# Patient Record
Sex: Female | Born: 1990 | Race: White | Hispanic: No | Marital: Single | Smoking: Never smoker
Health system: Southern US, Community
[De-identification: ages and names within clinical notes are randomized; demographics above are authoritative.]

## PROBLEM LIST (undated history)

## (undated) DIAGNOSIS — D649 Anemia, unspecified: Secondary | ICD-10-CM

## (undated) DIAGNOSIS — B009 Herpesviral infection, unspecified: Secondary | ICD-10-CM

## (undated) DIAGNOSIS — O24419 Gestational diabetes mellitus in pregnancy, unspecified control: Secondary | ICD-10-CM

## (undated) DIAGNOSIS — Z141 Cystic fibrosis carrier: Secondary | ICD-10-CM

## (undated) DIAGNOSIS — E669 Obesity, unspecified: Secondary | ICD-10-CM

## (undated) DIAGNOSIS — A5901 Trichomonal vulvovaginitis: Secondary | ICD-10-CM

## (undated) HISTORY — DX: Anemia, unspecified: D64.9

## (undated) HISTORY — DX: Gestational diabetes mellitus in pregnancy, unspecified control: O24.419

---

## 2017-07-11 ENCOUNTER — Other Ambulatory Visit: Payer: Self-pay

## 2017-07-11 ENCOUNTER — Emergency Department (HOSPITAL_COMMUNITY)
Admission: EM | Admit: 2017-07-11 | Discharge: 2017-07-11 | Disposition: A | Discharge status: Home / Self Care | Payer: Worker's Compensation | Attending: Emergency Medicine | Admitting: Emergency Medicine

## 2017-07-11 ENCOUNTER — Encounter (HOSPITAL_COMMUNITY): Payer: Self-pay | Admitting: *Deleted

## 2017-07-11 DIAGNOSIS — Z23 Encounter for immunization: Secondary | ICD-10-CM | POA: Insufficient documentation

## 2017-07-11 DIAGNOSIS — Y999 Unspecified external cause status: Secondary | ICD-10-CM | POA: Insufficient documentation

## 2017-07-11 DIAGNOSIS — Y929 Unspecified place or not applicable: Secondary | ICD-10-CM | POA: Diagnosis not present

## 2017-07-11 DIAGNOSIS — Y939 Activity, unspecified: Secondary | ICD-10-CM | POA: Diagnosis not present

## 2017-07-11 DIAGNOSIS — W540XXA Bitten by dog, initial encounter: Secondary | ICD-10-CM | POA: Diagnosis not present

## 2017-07-11 DIAGNOSIS — S81852A Open bite, left lower leg, initial encounter: Secondary | ICD-10-CM | POA: Diagnosis not present

## 2017-07-11 MED ORDER — MUPIROCIN CALCIUM 2 % EX CREA
TOPICAL_CREAM | Freq: Once | CUTANEOUS | Status: AC
  Administered 2017-07-11: 20:00:00 via TOPICAL
  Filled 2017-07-11: qty 15

## 2017-07-11 MED ORDER — AMOXICILLIN-POT CLAVULANATE 875-125 MG PO TABS: 1.0000 | ORAL_TABLET | Freq: Two times a day (BID) | ORAL | 0 refills | Status: DC

## 2017-07-11 MED ORDER — TETANUS-DIPHTH-ACELL PERTUSSIS 5-2.5-18.5 LF-MCG/0.5 IM SUSP
0.5000 mL | Freq: Once | INTRAMUSCULAR | Status: AC
  Administered 2017-07-11: 0.5 mL via INTRAMUSCULAR
  Filled 2017-07-11: qty 0.5

## 2017-07-11 MED ORDER — MUPIROCIN CALCIUM 2 % EX CREA: 1.0000 "application " | TOPICAL_CREAM | Freq: Two times a day (BID) | CUTANEOUS | 0 refills | Status: DC

## 2017-07-11 NOTE — ED Triage Notes (Signed)
The pt  Works for [employer] and today she was making a deliovery and she was bitten by a dog to her lt lower lateral leg this past Thursday.  The dog was unknown.  No fever but today her leg was burning and stinging.  Sl redness around the wound

## 2017-07-11 NOTE — ED Provider Notes (Signed)
[facility] Provider Note   CSN: [id number] Arrival date & time: 07/11/17  1805     History   Chief Complaint Chief Complaint  Patient presents with  . Animal Bite    HPI Sarah Villanueva is a 27 y.o. female presenting for evaluation of left lower leg pain.  Patient states that she was bit by a dog on Thursday of her left lower leg.  She treated it with alcohol, and has been keeping it covered.  Today she reports increasing swelling, pain, and burning at the site.  No drainage.  No pain with movement of the ankle or the foot.  Pain is worse with ambulation and palpation.  She has not taken anything for pain including Tylenol or ibuprofen.  No radiation of the pain.  No numbness or tingling.  No injury elsewhere.  She denies fevers, chills, nausea, vomiting.  She is not immunocompromised.  Not on blood thinners.  Unknown last tetanus shot.  Unknown if dogs are up-to-date on the rabies shots.  No allergies to antibiotics.  HPI  History reviewed. No pertinent past medical history.  There are no active problems to display for this patient.   History reviewed. No pertinent surgical history.   OB History   None      Home Medications    Prior to Admission medications   Medication Sig Start Date End Date Taking? Authorizing Provider  amoxicillin-clavulanate (AUGMENTIN) 875-125 MG tablet Take 1 tablet by mouth every 12 (twelve) hours. 07/11/17   Osie Merkin, PA-C  mupirocin cream (BACTROBAN) 2 % Apply 1 application topically 2 (two) times daily. 07/11/17   Trina Asch, PA-C    Family History No family history on file.  Social History Social History   Tobacco Use  . Smoking status: Never Smoker  . Smokeless tobacco: Never Used  Substance Use Topics  . Alcohol use: Yes  . Drug use: Not on file     Allergies   Patient has no known allergies.   Review of Systems Review of Systems  Skin: Positive for wound.    Allergic/Immunologic: Negative for immunocompromised state.  Neurological: Negative for numbness.  Hematological: Does not bruise/bleed easily.     Physical Exam Updated Vital Signs BP 126/72 (BP Location: Right Arm)   Pulse 87   Temp 98.7 F (37.1 C) (Oral)   Resp 17   Ht 5\' 11"  (1.803 m)   Wt 99.8 kg (220 lb)   LMP 06/20/2017   SpO2 100%   BMI 30.68 kg/m   Physical Exam  Constitutional: She is oriented to person, place, and time. She appears well-developed and well-nourished. No distress.  HENT:  Head: Normocephalic and atraumatic.  Eyes: EOM are normal.  Neck: Normal range of motion.  Pulmonary/Chest: Effort normal.  Abdominal: She exhibits no distension.  Musculoskeletal: Normal range of motion.  Neurological: She is alert and oriented to person, place, and time.  Skin: Skin is warm. No rash noted.  Lac of the left lateral lower leg without active bleeding or draining.  Mild surrounding erythema.  Tenderness to palpation of the surrounding skin.  Sensation intact bilaterally.  Pedal pulses equal bilaterally.  No streaking.  Psychiatric: She has a normal mood and affect.  Nursing note and vitals reviewed.      ED Treatments / Results  Labs (all labs ordered are listed, but only abnormal results are displayed) Labs Reviewed - No data to display  EKG None  Radiology No results found.  Procedures Procedures (including critical care time)  Medications Ordered in ED Medications  mupirocin cream (BACTROBAN) 2 % (has no administration in time range)  Tdap (BOOSTRIX) injection 0.5 mL (has no administration in time range)     Initial Impression / Assessment and Plan / ED Course  I have reviewed the triage vital signs and the nursing notes.  Pertinent labs & imaging results that were available during my care of the patient were reviewed by me and considered in my medical decision making (see chart for details).     Patient presenting for evaluation of  left lateral leg laceration.  Physical exam reassuring, no sign of streaking or systemic infection.  Will update tetanus.  Laceration cleaned and Bactroban applied.  Will start patient on Augmentin with daily Bactroban.  Discussed wound care.  Discussed follow-up if symptoms are not improving.  Discussed options for rabies treatment today versus contacting animal control to follow-up on rabies vaccination status.  Patient does not want rabies shots today.  At this time, patient appears safe for discharge.  Return precautions given.  Patient states she understands and agrees to plan.   Final Clinical Impressions(s) / ED Diagnoses   Final diagnoses:  Dog bite of left lower leg, initial encounter    ED Discharge Orders        Ordered    amoxicillin-clavulanate (AUGMENTIN) 875-125 MG tablet  Every 12 hours     07/11/17 1937    mupirocin cream (BACTROBAN) 2 %  2 times daily     07/11/17 1937       Alveria Apley, PA-C 07/11/17 1955    Nira Conn, MD 07/12/17 1306

## 2017-07-11 NOTE — ED Notes (Signed)
Pt has dog bite 1 inch long dog bite above ankle . Area is red. No discharge or foul odor.

## 2017-07-11 NOTE — Discharge Instructions (Signed)
Take antibiotics as prescribed.  Take the entire course, even if your symptoms improve. Use Bactroban ointment twice a day. Keep the area clean, wash daily with soap and water and apply a new dressing every day. Follow-up regarding the rabies status of the dog that bit you.  This can be done by contacting animal control. Return to the emergency room if your symptoms are not improving by Tuesday.  Return if you are developing high fevers, vomiting, red streaking up your leg, or any new or concerning symptoms.

## 2017-07-12 ENCOUNTER — Emergency Department (HOSPITAL_COMMUNITY)
Admission: EM | Admit: 2017-07-12 | Discharge: 2017-07-12 | Disposition: A | Discharge status: Home / Self Care | Payer: Worker's Compensation | Attending: Emergency Medicine | Admitting: Emergency Medicine

## 2017-07-12 ENCOUNTER — Emergency Department (HOSPITAL_COMMUNITY): Payer: Worker's Compensation

## 2017-07-12 ENCOUNTER — Encounter (HOSPITAL_COMMUNITY): Payer: Self-pay | Admitting: Emergency Medicine

## 2017-07-12 ENCOUNTER — Other Ambulatory Visit: Payer: Self-pay

## 2017-07-12 DIAGNOSIS — M791 Myalgia, unspecified site: Secondary | ICD-10-CM | POA: Diagnosis present

## 2017-07-12 DIAGNOSIS — T50A15A Adverse effect of pertussis vaccine, including combinations with a pertussis component, initial encounter: Secondary | ICD-10-CM | POA: Diagnosis not present

## 2017-07-12 DIAGNOSIS — T887XXA Unspecified adverse effect of drug or medicament, initial encounter: Secondary | ICD-10-CM | POA: Insufficient documentation

## 2017-07-12 DIAGNOSIS — Y658 Other specified misadventures during surgical and medical care: Secondary | ICD-10-CM | POA: Insufficient documentation

## 2017-07-12 DIAGNOSIS — R52 Pain, unspecified: Secondary | ICD-10-CM

## 2017-07-12 LAB — URINALYSIS, ROUTINE W REFLEX MICROSCOPIC
Bilirubin Urine: NEGATIVE
GLUCOSE, UA: NEGATIVE mg/dL
Hgb urine dipstick: NEGATIVE
Ketones, ur: NEGATIVE mg/dL
LEUKOCYTES UA: NEGATIVE
Nitrite: POSITIVE — AB
PROTEIN: NEGATIVE mg/dL
Specific Gravity, Urine: 1.026 (ref 1.005–1.030)
pH: 5 (ref 5.0–8.0)

## 2017-07-12 LAB — COMPREHENSIVE METABOLIC PANEL
ALBUMIN: 3.8 g/dL (ref 3.5–5.0)
ALT: 15 U/L (ref 14–54)
ANION GAP: 10 (ref 5–15)
AST: 21 U/L (ref 15–41)
Alkaline Phosphatase: 57 U/L (ref 38–126)
BUN: 9 mg/dL (ref 6–20)
CO2: 23 mmol/L (ref 22–32)
Calcium: 9.2 mg/dL (ref 8.9–10.3)
Chloride: 106 mmol/L (ref 101–111)
Creatinine, Ser: 0.77 mg/dL (ref 0.44–1.00)
GFR calc Af Amer: >60 mL/min (ref >60)
GFR calc non Af Amer: >60 mL/min (ref >60)
GLUCOSE: 97 mg/dL (ref 65–99)
POTASSIUM: 3.8 mmol/L (ref 3.5–5.1)
SODIUM: 139 mmol/L (ref 135–145)
Total Bilirubin: 0.8 mg/dL (ref 0.3–1.2)
Total Protein: 7.5 g/dL (ref 6.5–8.1)

## 2017-07-12 LAB — CBC WITH DIFFERENTIAL/PLATELET
BASOS ABS: 0 10*3/uL (ref 0.0–0.1)
Basophils Relative: 0 %
Eosinophils Absolute: 0.1 10*3/uL (ref 0.0–0.7)
Eosinophils Relative: 1 %
HEMATOCRIT: 38.7 % (ref 36.0–46.0)
Hemoglobin: 12.1 g/dL (ref 12.0–15.0)
LYMPHS PCT: 24 %
Lymphs Abs: 1.3 10*3/uL (ref 0.7–4.0)
MCH: 26.8 pg (ref 26.0–34.0)
MCHC: 31.3 g/dL (ref 30.0–36.0)
MCV: 85.6 fL (ref 78.0–100.0)
MONO ABS: 0.9 10*3/uL (ref 0.1–1.0)
MONOS PCT: 16 %
NEUTROS ABS: 3.3 10*3/uL (ref 1.7–7.7)
Neutrophils Relative %: 59 %
Platelets: 268 10*3/uL (ref 150–400)
RBC: 4.52 MIL/uL (ref 3.87–5.11)
RDW: 12.9 % (ref 11.5–15.5)
WBC: 5.5 10*3/uL (ref 4.0–10.5)

## 2017-07-12 LAB — I-STAT BETA HCG BLOOD, ED (MC, WL, AP ONLY): I-stat hCG, quantitative: <5 m[IU]/mL (ref <5)

## 2017-07-12 LAB — I-STAT CG4 LACTIC ACID, ED: Lactic Acid, Venous: 0.71 mmol/L (ref 0.5–1.9)

## 2017-07-12 MED ORDER — AMOXICILLIN-POT CLAVULANATE 875-125 MG PO TABS
1.0000 | ORAL_TABLET | Freq: Once | ORAL | Status: DC
  Filled 2017-07-12: qty 1

## 2017-07-12 MED ORDER — AMOXICILLIN-POT CLAVULANATE 400-57 MG/5ML PO SUSR
875.0000 mg | Freq: Once | ORAL | Status: AC
  Administered 2017-07-12: 872 mg via ORAL
  Filled 2017-07-12: qty 10.9

## 2017-07-12 NOTE — Discharge Instructions (Signed)
Your symptoms are likely from the fact that you had your tetanus shot updated yesterday. This is not abnormal to have these symptoms after getting an immunization. Alternate between tylenol and motrin as needed for pain, use ice or heat to the areas of soreness as needed to help with pain. Stay well hydrated, get plenty of rest. Take your antibiotics as directed until completed. Follow up with the [facility] in 1 week for recheck of symptoms and to establish medical care. Return to the ER for emergent changes or worsening symptoms.

## 2017-07-12 NOTE — ED Provider Notes (Signed)
[facility] EMERGENCY DEPARTMENT Provider Note   CSN: [id number] Arrival date & time: 07/12/17  4098     History   Chief Complaint Chief Complaint  Patient presents with  . Generalized Body Aches    arm pits are sore    HPI Sarah Villanueva is a 27 y.o. otherwise healthy female, who presents to the ED with complaints of fatigue/lightheadedness, body aches, soreness to her inguinal region and armpits bilaterally, and chills that began when she woke up this morning around 8:30am (4hrs prior to evaluation).  Chart review reveals that she was seen here last night for a dog bite, received Tdap shot, sent home with augmentin rx.  She states she hasn't yet started the augmentin, however this morning she woke up "not feeling well" with the aforementioned symptoms.  She has not tried anything for her symptoms and there are no known aggravating factors.  She denies fevers, URI symptoms, cough, CP, SOB, abd pain, N/V/D/C, hematuria, dysuria, arthralgias, HA, vision changes, neck stiffness, numbness, tingling, focal weakness, or any other complaints at this time.  She also denies any changes in her dog bite wound (which was originally sustained on Thursday, 3 days ago).   The history is provided by the patient and medical records. No language interpreter was used.  Illness  This is a new problem. The current episode started 3 to 5 hours ago. The problem occurs constantly. The problem has not changed since onset.Pertinent negatives include no chest pain, no abdominal pain, no headaches and no shortness of breath. Nothing aggravates the symptoms. Nothing relieves the symptoms. She has tried nothing for the symptoms. The treatment provided no relief.    History reviewed. No pertinent past medical history.  There are no active problems to display for this patient.   History reviewed. No pertinent surgical history.   OB History   None      Home Medications    Prior to Admission  medications   Medication Sig Start Date End Date Taking? Authorizing Provider  amoxicillin-clavulanate (AUGMENTIN) 875-125 MG tablet Take 1 tablet by mouth every 12 (twelve) hours. 07/11/17   Caccavale, Sophia, PA-C  mupirocin cream (BACTROBAN) 2 % Apply 1 application topically 2 (two) times daily. 07/11/17   Caccavale, Sophia, PA-C    Family History No family history on file.  Social History Social History   Tobacco Use  . Smoking status: Never Smoker  . Smokeless tobacco: Never Used  Substance Use Topics  . Alcohol use: Yes  . Drug use: Not on file     Allergies   Patient has no known allergies.   Review of Systems Review of Systems  Constitutional: Positive for chills and fatigue. Negative for fever.  HENT: Negative for rhinorrhea and sore throat.   Eyes: Negative for visual disturbance.  Respiratory: Negative for cough and shortness of breath.   Cardiovascular: Negative for chest pain.  Gastrointestinal: Negative for abdominal pain, constipation, diarrhea, nausea and vomiting.  Genitourinary: Negative for dysuria and hematuria.  Musculoskeletal: Positive for myalgias. Negative for arthralgias, neck pain and neck stiffness.  Skin: Negative for color change.  Allergic/Immunologic: Negative for immunocompromised state.  Neurological: Positive for light-headedness. Negative for weakness, numbness and headaches.  Psychiatric/Behavioral: Negative for confusion.   All other systems reviewed and are negative for acute change except as noted in the HPI.    Physical Exam Updated Vital Signs BP 119/78 (BP Location: Right Arm)   Pulse 86   Temp 98.8 F (37.1 C) (Oral)  Resp 16   Ht 5\' 11"  (1.803 m)   Wt 99.8 kg (220 lb)   LMP 06/20/2017   SpO2 100%   BMI 30.68 kg/m   Physical Exam  Constitutional: She is oriented to person, place, and time. Vital signs are normal. She appears well-developed and well-nourished.  Non-toxic appearance. No distress.  Afebrile, nontoxic, NAD   HENT:  Head: Normocephalic and atraumatic.  Mouth/Throat: Oropharynx is clear and moist and mucous membranes are normal.  Eyes: Conjunctivae and EOM are normal. Right eye exhibits no discharge. Left eye exhibits no discharge.  Neck: Normal range of motion. Neck supple.  Cardiovascular: Normal rate, regular rhythm, normal heart sounds and intact distal pulses. Exam reveals no gallop and no friction rub.  No murmur heard. Pulmonary/Chest: Effort normal and breath sounds normal. No respiratory distress. She has no decreased breath sounds. She has no wheezes. She has no rhonchi. She has no rales.  Abdominal: Soft. Normal appearance and bowel sounds are normal. She exhibits no distension. There is no tenderness. There is no rigidity, no rebound, no guarding, no CVA tenderness, no tenderness at McBurney's point and negative Murphy's sign.  Musculoskeletal: Normal range of motion.  Neurological: She is alert and oriented to person, place, and time. She has normal strength. No sensory deficit.  Skin: Skin is warm, dry and intact. No rash noted.  Psychiatric: She has a normal mood and affect.  Nursing note and vitals reviewed.    ED Treatments / Results  Labs (all labs ordered are listed, but only abnormal results are displayed) Labs Reviewed  URINALYSIS, ROUTINE W REFLEX MICROSCOPIC - Abnormal; Notable for the following components:      Result Value   APPearance HAZY (*)    Nitrite POSITIVE (*)    Bacteria, UA FEW (*)    Squamous Epithelial / LPF 0-5 (*)    All other components within normal limits  COMPREHENSIVE METABOLIC PANEL  CBC WITH DIFFERENTIAL/PLATELET  I-STAT CG4 LACTIC ACID, ED  I-STAT BETA HCG BLOOD, ED ([facility], [facility], [facility] ONLY)    EKG None  Radiology Dg Chest 2 View  Result Date: 07/12/2017 CLINICAL DATA:  Body aches EXAM: CHEST - 2 VIEW COMPARISON:  08/24/2007 FINDINGS: The heart size and mediastinal contours are within normal limits. Both lungs are clear. The visualized  skeletal structures are unremarkable. IMPRESSION: No active cardiopulmonary disease. Electronically Signed   By: Marlan Palau M.D.   On: 07/12/2017 10:04    Procedures Procedures (including critical care time)  Medications Ordered in ED Medications  amoxicillin-clavulanate (AUGMENTIN) 400-57 MG/5ML suspension 872 mg (872 mg Oral Given 07/12/17 1339)     Initial Impression / Assessment and Plan / ED Course  I have reviewed the triage vital signs and the nursing notes.  Pertinent labs & imaging results that were available during my care of the patient were reviewed by me and considered in my medical decision making (see chart for details).     27 y.o. female here with body aches, fatigue/lightheadedness, soreness in the groin/armpits, and chills this morning after getting her TDap updated yesterday for a dog bite. On exam, no meningismus, afebrile and nontoxic, in NAD, no abdominal tenderness, clear lungs, dog bite to LLE with minimal erythema around the edges of the wound but no warmth or spreading redness, no drainage, no evidence of cellulitis. Work up done in triage shows: lactic WNL, betaHCG neg, CMP WNL, CBC WNL, CXR neg, U/A with positive nitrites but otherwise no evidence of infection. Pt without  symptoms of UTI, doubt this is clinically significant. Overall, her symptoms are likely just due to normal immune response from getting immunization, doubt other acute emergent pathology. Advised tylenol/motrin/ice/heat use, continuation of her antibiotics (she hasn't started yet, so first dose given here), and f/up with [facility] in 1wk for recheck of symptoms and to establish medical care. I explained the diagnosis and have given explicit precautions to return to the ER including for any other new or worsening symptoms. The patient understands and accepts the medical plan as it's been dictated and I have answered their questions. Discharge instructions concerning home care and prescriptions have been  given. The patient is STABLE and is discharged to home in good condition.    Final Clinical Impressions(s) / ED Diagnoses   Final diagnoses:  Body aches  Reaction to DTaP immunization, initial encounter    ED Discharge Orders    [address]       Shakeitha Umbaugh, [place]restMercedes, [place] JerseyPA-C 07/12/17 1351    Tilden Fossaees, Elizabeth, MD 07/12/17 1701

## 2017-07-12 NOTE — ED Triage Notes (Signed)
Pt. Stated, I was here yesterday for a dog bite and was given a Tetanus and last night I started having flu-like symptoms. And my arm pits are sore.

## 2018-04-19 ENCOUNTER — Encounter: Payer: Self-pay | Admitting: Obstetrics & Gynecology

## 2018-04-19 ENCOUNTER — Ambulatory Visit: Payer: Self-pay | Admitting: Obstetrics & Gynecology

## 2018-04-19 VITALS — BP 113/66 | HR 87 | Ht 71.0 in | Wt 210.0 lb

## 2018-04-19 DIAGNOSIS — Z01419 Encounter for gynecological examination (general) (routine) without abnormal findings: Secondary | ICD-10-CM

## 2018-04-19 DIAGNOSIS — N926 Irregular menstruation, unspecified: Secondary | ICD-10-CM

## 2018-04-19 DIAGNOSIS — Z113 Encounter for screening for infections with a predominantly sexual mode of transmission: Secondary | ICD-10-CM

## 2018-04-19 DIAGNOSIS — Z124 Encounter for screening for malignant neoplasm of cervix: Secondary | ICD-10-CM

## 2018-04-19 NOTE — Progress Notes (Signed)
Subjective:    Sarah Villanueva is a 28 y.o. single P0 female who presents for an annual exam. She has the issues of irregular periods and some pelvic. The pain is generally daily, "sharp cramping". She has not tried IBU/tylenol.  The patient is sexually active. GYN screening history: no prior history of gyn screening tests. The patient wears seatbelts: yes. The patient participates in regular exercise: yes. Has the patient ever been transfused or tattooed?: no. The patient reports that there is not domestic violence in her life.   Menstrual History: OB History    Gravida  1   Para      Term      Preterm      AB  1   Living        SAB      TAB  1   Ectopic      Multiple      Live Births              Menarche age: [age] [address] at [age] She does not use contraception. She would be ok if she got pregnant, on PNV. In 2019 she had monthly periods. Since the middle of 12/19 she has had off and on bleeding every other week. It is heavy. Monogamous for about 18 months  Patient's last menstrual period was 04/07/2018.    The following portions of the patient's history were reviewed and updated as appropriate: allergies, current medications, past family history, past medical history, past social history, past surgical history and problem list.  Review of Systems Pertinent items are noted in HPI.   FH- + breast cancer- paternal great aunt and great GM, no colon cancer Works at [employer]    Objective:    BP 113/66   Pulse 87   Ht 5\' 11"  (1.803 m)   Wt 210 lb (95.3 kg)   LMP 04/07/2018   BMI 29.29 kg/m   General Appearance:    Alert, cooperative, no distress, appears stated age  Head:    Normocephalic, without obvious abnormality, atraumatic  Eyes:    PERRL, conjunctiva/corneas clear, EOM's intact, fundi    benign, both eyes  Ears:    Normal TM's and external ear canals, both ears  Nose:   Nares normal, septum midline, mucosa normal, no drainage    or sinus tenderness   Throat:   Lips, mucosa, and tongue normal; teeth and gums normal  Neck:   Supple, symmetrical, trachea midline, no adenopathy;    thyroid:  no enlargement/tenderness/nodules; no carotid   bruit or JVD  Back:     Symmetric, no curvature, ROM normal, no CVA tenderness  Lungs:     Clear to auscultation bilaterally, respirations unlabored  Chest Wall:    No tenderness or deformity   Heart:    Regular rate and rhythm, S1 and S2 normal, no murmur, rub   or gallop  Breast Exam:    No tenderness, masses, or nipple abnormality  Abdomen:     Soft, non-tender, bowel sounds active all four quadrants,    no masses, no organomegaly  Genitalia:    Normal female without lesion, discharge or tenderness, nulliparous cervix,  normal size and shape, anteverted, mobile, non-tender, normal adnexal exam      Extremities:   Extremities normal, atraumatic, no cyanosis or edema  Pulses:   2+ and symmetric all extremities  Skin:   Skin color, texture, turgor normal, no rashes or lesions  Lymph nodes:   Cervical, supraclavicular, and axillary  nodes normal  Neurologic:   CNII-XII intact, normal strength, sensation and reflexes    throughout  .    Assessment:    Healthy female exam.   Irregular bleeding   Plan:     Thin prep Pap smear. with STI testing Information on Gardasil given She declines a flu vaccine Gyn u/s ordered Check TSH

## 2018-04-19 NOTE — Patient Instructions (Signed)
Human Papillomavirus Quadrivalent Vaccine suspension for injection What is this medicine? HUMAN PAPILLOMAVIRUS VACCINE (HYOO muhn pap uh LOH muh vahy ruhs vak SEEN) is a vaccine. It is used to prevent infections of four types of the human papillomavirus. In women, the vaccine may lower your risk of getting cervical, vaginal, vulvar, or anal cancer and genital warts. In men, the vaccine may lower your risk of getting genital warts and anal cancer. You cannot get these diseases from the vaccine. This vaccine does not treat these diseases. This medicine may be used for other purposes; ask your health care provider or pharmacist if you have questions. COMMON BRAND NAME(S): Gardasil What should I tell my health care provider before I take this medicine? They need to know if you have any of these conditions: -fever or infection -hemophilia -HIV infection or AIDS -immune system problems -low platelet count -an unusual reaction to Human Papillomavirus Vaccine, yeast, other medicines, foods, dyes, or preservatives -pregnant or trying to get pregnant -breast-feeding How should I use this medicine? This vaccine is for injection in a muscle on your upper arm or thigh. It is given by a health care professional. Dennis Bast will be observed for 15 minutes after each dose. Sometimes, fainting happens after the vaccine is given. You may be asked to sit or lie down during the 15 minutes. Three doses are given. The second dose is given 2 months after the first dose. The last dose is given 4 months after the second dose. A copy of a Vaccine Information Statement will be given before each vaccination. Read this sheet carefully each time. The sheet may change frequently. Talk to your pediatrician regarding the use of this medicine in children. While this drug may be prescribed for children as young as 11 years of age for selected conditions, precautions do apply. Overdosage: If you think you have taken too much of this  medicine contact a poison control center or emergency room at once. NOTE: This medicine is only for you. Do not share this medicine with others. What if I miss a dose? All 3 doses of the vaccine should be given within 6 months. Remember to keep appointments for follow-up doses. Your health care provider will tell you when to return for the next vaccine. Ask your health care professional for advice if you are unable to keep an appointment or miss a scheduled dose. What may interact with this medicine? -other vaccines This list may not describe all possible interactions. Give your health care provider a list of all the medicines, herbs, non-prescription drugs, or dietary supplements you use. Also tell them if you smoke, drink alcohol, or use illegal drugs. Some items may interact with your medicine. What should I watch for while using this medicine? This vaccine may not fully protect everyone. Continue to have regular pelvic exams and cervical or anal cancer screenings as directed by your doctor. The Human Papillomavirus is a sexually transmitted disease. It can be passed by any kind of sexual activity that involves genital contact. The vaccine works best when given before you have any contact with the virus. Many people who have the virus do not have any signs or symptoms. Tell your doctor or health care professional if you have any reaction or unusual symptom after getting the vaccine. What side effects may I notice from receiving this medicine? Side effects that you should report to your doctor or health care professional as soon as possible: -allergic reactions like skin rash, itching or hives, swelling  from receiving this medicine?  Side effects that you should report to your doctor or health care professional as soon as possible:  -allergic reactions like skin rash, itching or hives, swelling of the face, lips, or tongue  -breathing problems  -feeling faint or lightheaded, falls  Side effects that usually do not require medical attention (report to your doctor or health care professional if they continue or are bothersome):  -cough  -dizziness  -fever  -headache  -nausea  -redness, warmth,  swelling, pain, or itching at site where injected  This list may not describe all possible side effects. Call your doctor for medical advice about side effects. You may report side effects to FDA at 1-800-FDA-1088.  Where should I keep my medicine?  This drug is given in a hospital or clinic and will not be stored at home.  NOTE: This sheet is a summary. It may not cover all possible information. If you have questions about this medicine, talk to your doctor, pharmacist, or health care provider.   2019 Elsevier/Gold Standard (2013-05-16 13:14:33)

## 2018-04-19 NOTE — Progress Notes (Signed)
Pt c/o irregular bleeding and pelvic pain

## 2018-04-21 LAB — CYTOLOGY - PAP
CHLAMYDIA, DNA PROBE: NEGATIVE
DIAGNOSIS: NEGATIVE
Neisseria Gonorrhea: NEGATIVE

## 2018-04-22 ENCOUNTER — Emergency Department (HOSPITAL_COMMUNITY): Payer: Self-pay

## 2018-04-22 ENCOUNTER — Encounter (HOSPITAL_COMMUNITY): Payer: Self-pay

## 2018-04-22 ENCOUNTER — Other Ambulatory Visit: Payer: Self-pay

## 2018-04-22 ENCOUNTER — Emergency Department (HOSPITAL_COMMUNITY)
Admission: EM | Admit: 2018-04-22 | Discharge: 2018-04-23 | Disposition: A | Discharge status: Home / Self Care | Payer: Self-pay | Attending: Emergency Medicine | Admitting: Emergency Medicine

## 2018-04-22 DIAGNOSIS — R079 Chest pain, unspecified: Secondary | ICD-10-CM | POA: Insufficient documentation

## 2018-04-22 LAB — CBC
HEMATOCRIT: 37.9 % (ref 36.0–46.0)
Hemoglobin: 11.9 g/dL — ABNORMAL LOW (ref 12.0–15.0)
MCH: 27.1 pg (ref 26.0–34.0)
MCHC: 31.4 g/dL (ref 30.0–36.0)
MCV: 86.3 fL (ref 80.0–100.0)
Platelets: 392 10*3/uL (ref 150–400)
RBC: 4.39 MIL/uL (ref 3.87–5.11)
RDW: 12.7 % (ref 11.5–15.5)
WBC: 13 10*3/uL — AB (ref 4.0–10.5)
nRBC: 0 % (ref 0.0–0.2)

## 2018-04-22 LAB — BASIC METABOLIC PANEL
Anion gap: 8 (ref 5–15)
BUN: 12 mg/dL (ref 6–20)
CHLORIDE: 106 mmol/L (ref 98–111)
CO2: 26 mmol/L (ref 22–32)
CREATININE: 0.68 mg/dL (ref 0.44–1.00)
Calcium: 9.8 mg/dL (ref 8.9–10.3)
GFR calc Af Amer: >60 mL/min (ref >60)
GFR calc non Af Amer: >60 mL/min (ref >60)
GLUCOSE: 91 mg/dL (ref 70–99)
Potassium: 3.6 mmol/L (ref 3.5–5.1)
SODIUM: 140 mmol/L (ref 135–145)

## 2018-04-22 LAB — I-STAT TROPONIN, ED: Troponin i, poc: 0 ng/mL (ref 0.00–0.08)

## 2018-04-22 LAB — I-STAT BETA HCG BLOOD, ED (MC, WL, AP ONLY): I-stat hCG, quantitative: <5 m[IU]/mL (ref <5)

## 2018-04-22 MED ORDER — SODIUM CHLORIDE 0.9% FLUSH: 3.0000 mL | Freq: Once | INTRAVENOUS | Status: DC

## 2018-04-22 NOTE — ED Triage Notes (Signed)
Pt here with chest pain this this am.  Pain in the center of the chest that goes to her back.  A&Ox4.  No shortness of breath.

## 2018-04-23 NOTE — Discharge Instructions (Signed)
You lab tests, ECG, and chest x-ray are all essentially negative. You can be discharged home and should follow up with primary care if the symptoms return. Come back to the emergency department if your pain becomes severe, you develop a high fever or for new concern.

## 2018-04-23 NOTE — ED Provider Notes (Signed)
[facility] EMERGENCY DEPARTMENT Provider Note   CSN: [id number] Arrival date & time: 04/22/18  1907     History   Chief Complaint Chief Complaint  Patient presents with  . Chest Pain    HPI Sarah Villanueva is a 28 y.o. female.  Patient to ED with left chest pain that started yesterday morning while getting ready for work and remaining constant throughout the day. She states "I feel out of breath but I'm not short of breath". No cough, congestion, fever. The pain radiated into her back earlier in the day. No aggravating or alleviating factors, including eating/drinking, position, exertion. No risk factors for PE. She states the pain has resolved now without any intervention.  The history is provided by the patient. No language interpreter was used.  Chest Pain  Associated symptoms: no cough, no fever, no nausea, no palpitations, no vomiting and no weakness     History reviewed. No pertinent past medical history.  There are no active problems to display for this patient.   History reviewed. No pertinent surgical history.   OB History    Gravida  1   Para      Term      Preterm      AB  1   Living        SAB      TAB  1   Ectopic      Multiple      Live Births               Home Medications    Prior to Admission medications   Not on File    Family History Family History  Problem Relation Age of Onset  . Hypertension Paternal Grandfather   . Diabetes Paternal Grandfather   . Hypertension Paternal Grandmother   . Diabetes Paternal Grandmother   . Diabetes Father   . Hypertension Father   . Cervical cancer Mother     Social History Social History   Tobacco Use  . Smoking status: Never Smoker  . Smokeless tobacco: Never Used  Substance Use Topics  . Alcohol use: Yes    Comment: occ  . Drug use: Never     Allergies   Patient has no known allergies.   Review of Systems Review of Systems  Constitutional:  Negative for chills and fever.  HENT: Negative.   Respiratory: Negative.  Negative for cough.        See HPI.  Cardiovascular: Positive for chest pain. Negative for palpitations.  Gastrointestinal: Negative.  Negative for nausea and vomiting.  Musculoskeletal: Negative.  Negative for myalgias.  Skin: Negative.   Neurological: Negative.  Negative for syncope, weakness and light-headedness.     Physical Exam Updated Vital Signs BP 98/60   Pulse 69   Temp 98 F (36.7 C) (Oral)   Resp 16   Ht 5\' 11"  (1.803 m)   Wt 95.3 kg   LMP 04/07/2018   SpO2 97%   BMI 29.29 kg/m   Physical Exam Vitals signs and nursing note reviewed.  Constitutional:      Appearance: She is well-developed.  HENT:     Head: Normocephalic.  Neck:     Musculoskeletal: Normal range of motion and neck supple.  Cardiovascular:     Rate and Rhythm: Normal rate and regular rhythm.     Heart sounds: No murmur.  Pulmonary:     Effort: Pulmonary effort is normal.     Breath sounds: Normal breath sounds.  No wheezing, rhonchi or rales.  Chest:     Chest wall: No tenderness.  Abdominal:     General: Bowel sounds are normal.     Palpations: Abdomen is soft.     Tenderness: There is no abdominal tenderness. There is no guarding or rebound.  Musculoskeletal: Normal range of motion.     Right lower leg: No edema.     Left lower leg: No edema.  Skin:    General: Skin is warm and dry.     Findings: No rash.  Neurological:     Mental Status: She is alert and oriented to person, place, and time.      ED Treatments / Results  Labs (all labs ordered are listed, but only abnormal results are displayed) Labs Reviewed  CBC - Abnormal; Notable for the following components:      Result Value   WBC 13.0 (*)    Hemoglobin 11.9 (*)    All other components within normal limits  BASIC METABOLIC PANEL  I-STAT TROPONIN, ED  I-STAT BETA HCG BLOOD, ED ([facility], [facility], [facility] ONLY)    EKG EKG  Interpretation  Date/Time:  Thursday April 22 2018 19:21:47 EST Ventricular Rate:  70 PR Interval:  148 QRS Duration: 96 QT Interval:  366 QTC Calculation: 395 R Axis:   80 Text Interpretation:  Normal sinus rhythm Normal ECG No old tracing to compare Confirmed by Dione BoozeGlick, David ([id number]) on 04/23/2018 2:51:46 AM   Radiology Dg Chest 2 View  Result Date: 04/22/2018 CLINICAL DATA:  LEFT side chest pain, nausea, and shortness of breath for 1 day EXAM: CHEST - 2 VIEW COMPARISON:  07/12/2017 FINDINGS: Normal heart size, mediastinal contours, and pulmonary vascularity. Lungs clear. No pleural effusion or pneumothorax. Bones unremarkable. IMPRESSION: Normal exam. Electronically Signed   By: Ulyses SouthwardMark  Boles M.D.   On: 04/22/2018 20:19    Procedures Procedures (including critical care time)  Medications Ordered in ED Medications  sodium chloride flush (NS) 0.9 % injection 3 mL (has no administration in time range)     Initial Impression / Assessment and Plan / ED Course  I have reviewed the triage vital signs and the nursing notes.  Pertinent labs & imaging results that were available during my care of the patient were reviewed by me and considered in my medical decision making (see chart for details).     Patient to ED with left chest pain, sharp, constant for 24 hours, now resolved. No cough, fever. No modifying factors.   Pain atypical for ACS, negative troponin, EKG. Doubt acute event. No risk factors for PE and she is PERC negative. No cough, congestion, fever or x-ray findings to suggest infection. No chest wall tenderness to suggest musculoskeletal pain. Pain is resolved at present.   She can be discharged home and is encouraged to follow up with PCP.    Final Clinical Impressions(s) / ED Diagnoses   Final diagnoses:  None   1. Nonspecific chest pain  ED Discharge Orders    None       Elpidio AnisUpstill, Reshawn Ostlund, PA-C 04/23/18 0441    Dione BoozeGlick, David, MD 04/23/18 [phone]

## 2018-04-27 ENCOUNTER — Ambulatory Visit (INDEPENDENT_AMBULATORY_CARE_PROVIDER_SITE_OTHER): Payer: Self-pay

## 2018-04-27 DIAGNOSIS — N926 Irregular menstruation, unspecified: Secondary | ICD-10-CM

## 2018-09-09 ENCOUNTER — Other Ambulatory Visit: Payer: Self-pay

## 2018-09-09 ENCOUNTER — Ambulatory Visit (INDEPENDENT_AMBULATORY_CARE_PROVIDER_SITE_OTHER): Payer: 59 | Admitting: Obstetrics & Gynecology

## 2018-09-09 ENCOUNTER — Encounter: Payer: Self-pay | Admitting: Obstetrics & Gynecology

## 2018-09-09 VITALS — BP 112/75 | HR 75 | Ht 71.0 in | Wt 215.0 lb

## 2018-09-09 DIAGNOSIS — N760 Acute vaginitis: Secondary | ICD-10-CM

## 2018-09-09 DIAGNOSIS — B9689 Other specified bacterial agents as the cause of diseases classified elsewhere: Secondary | ICD-10-CM | POA: Diagnosis not present

## 2018-09-09 DIAGNOSIS — N898 Other specified noninflammatory disorders of vagina: Secondary | ICD-10-CM | POA: Diagnosis not present

## 2018-09-09 MED ORDER — FLUCONAZOLE 150 MG PO TABS: 150.0000 mg | ORAL_TABLET | Freq: Once | ORAL | 3 refills | Status: AC

## 2018-09-09 NOTE — Progress Notes (Signed)
Patient ID: Sarah Villanueva, female   DOB: 03-17-1991, 28 y.o.   MRN: [id number]  Chief Complaint  Patient presents with  . Painful Intercourse    HPI Sarah Villanueva is a 28 y.o. female. She is here today with the issue of some intermittent burning/discomfort after sex. This first started 2 1/2 weeks ago. She has not tried any OTC creams. She tried a pH balance vaginal wash which may have helped a little. She has had sex about 5 times in the last 2 1/2 weeks. The burning sensation has been there after about 3 of those occasions. She is not using condoms, lubricants, toys. Besides the new pH wash, she has not changed any detergents, soaps, shampoos, etc.   History reviewed. No pertinent past medical history.  History reviewed. No pertinent surgical history.  Family History  Problem Relation Age of Onset  . Hypertension Paternal Grandfather   . Diabetes Paternal Grandfather   . Hypertension Paternal Grandmother   . Diabetes Paternal Grandmother   . Diabetes Father   . Hypertension Father   . Cervical cancer Mother     Social History Social History   Tobacco Use  . Smoking status: Never Smoker  . Smokeless tobacco: Never Used  Substance Use Topics  . Alcohol use: Yes    Comment: occ  . Drug use: Never    No Known Allergies  No current outpatient medications on file.   No current facility-administered medications for this visit.     Review of Systems Review of Systems Pap normal 1/20 Works at [employer] Ex She has not had a new sexual partner  Blood pressure 112/75, pulse 75, height 5\' 11"  (1.803 m), weight 215 lb (97.5 kg), last menstrual period 09/07/2018.  Physical Exam Physical Exam Breathing, conversing, and ambulating normally Well nourished, well hydrated White female, no apparent distress EG- shaved, all normal Spec exam reveals discharge c/w yeast Data Reviewed Pap normal Assessment Vulvar burning after sex, I suspect that yeast infection is the  etiology  Plan Wet prep sent Diflucan prescribed with refills Call back if not better by next week    Kerman Pfost C [place] 09/09/2018, 2:08 PM

## 2018-09-10 LAB — CERVICOVAGINAL ANCILLARY ONLY
Bacterial vaginitis: POSITIVE — AB
Candida vaginitis: NEGATIVE

## 2018-09-13 ENCOUNTER — Other Ambulatory Visit: Payer: Self-pay | Admitting: Obstetrics & Gynecology

## 2018-09-13 MED ORDER — METRONIDAZOLE 500 MG PO TABS: 500.0000 mg | ORAL_TABLET | Freq: Two times a day (BID) | ORAL | 0 refills | Status: DC

## 2018-09-13 NOTE — Progress Notes (Unsigned)
Flagyl prescribed for BV seen on wet prep.

## 2018-09-20 ENCOUNTER — Other Ambulatory Visit: Payer: Self-pay | Admitting: *Deleted

## 2018-09-20 MED ORDER — METRONIDAZOLE 500 MG PO TABS: 500.0000 mg | ORAL_TABLET | Freq: Two times a day (BID) | ORAL | 0 refills | Status: DC

## 2018-12-03 ENCOUNTER — Telehealth: Payer: Self-pay | Admitting: Obstetrics & Gynecology

## 2018-12-03 NOTE — Telephone Encounter (Signed)
I returned her call and left a voicemail. I told her that cramping is not unusual. I told her that if IBU and tylenol relieve the pain, then she should be reassured. If not, she should make an appt to be seen in the office.

## 2018-12-14 ENCOUNTER — Encounter: Payer: Self-pay | Admitting: *Deleted

## 2018-12-15 ENCOUNTER — Encounter: Payer: Self-pay | Admitting: Obstetrics & Gynecology

## 2018-12-16 ENCOUNTER — Other Ambulatory Visit: Payer: Self-pay

## 2018-12-16 ENCOUNTER — Encounter: Payer: Self-pay | Admitting: Obstetrics & Gynecology

## 2018-12-16 ENCOUNTER — Ambulatory Visit (INDEPENDENT_AMBULATORY_CARE_PROVIDER_SITE_OTHER): Payer: 59 | Admitting: Obstetrics & Gynecology

## 2018-12-16 ENCOUNTER — Other Ambulatory Visit: Payer: 59

## 2018-12-16 DIAGNOSIS — Z113 Encounter for screening for infections with a predominantly sexual mode of transmission: Secondary | ICD-10-CM

## 2018-12-16 DIAGNOSIS — O99210 Obesity complicating pregnancy, unspecified trimester: Secondary | ICD-10-CM

## 2018-12-16 DIAGNOSIS — Z6832 Body mass index (BMI) 32.0-32.9, adult: Secondary | ICD-10-CM | POA: Insufficient documentation

## 2018-12-16 DIAGNOSIS — O99211 Obesity complicating pregnancy, first trimester: Secondary | ICD-10-CM

## 2018-12-16 DIAGNOSIS — Z3A10 10 weeks gestation of pregnancy: Secondary | ICD-10-CM | POA: Diagnosis not present

## 2018-12-16 DIAGNOSIS — Z3490 Encounter for supervision of normal pregnancy, unspecified, unspecified trimester: Secondary | ICD-10-CM | POA: Insufficient documentation

## 2018-12-16 MED ORDER — BLOOD PRESSURE KIT DEVI: 1.0000 | 0 refills | Status: DC

## 2018-12-16 NOTE — Addendum Note (Signed)
Addended by: Asencion Islam on: 12/16/2018 01:51 PM   Modules accepted: Orders

## 2018-12-16 NOTE — Progress Notes (Signed)
  Subjective:    Sarah Villanueva is a single G2P0A1 being seen today for her first obstetrical visit.  This is sort of a planned pregnancy. She is at [redacted]w[redacted]d gestation. Her obstetrical history is significant for obesity. Relationship with FOB: significant other, living together. Patient does intend to breast feed. Pregnancy history fully reviewed.  Patient reports no complaints.  Review of Systems:   Review of Systems  Objective:     BP 112/70   Pulse 87   Wt 217 lb (98.4 kg)   LMP 10/05/2018   BMI 30.27 kg/m  Physical Exam  Exam  Breathing, conversing, and ambulating normally Well nourished, well hydrated White female, no apparent distress Heart- rrr Lungs- CTAB Abd- benign     Assessment:    Pregnancy: G2P0010 Patient Active Problem List   Diagnosis Date Noted  . Supervision of normal pregnancy 12/16/2018  . Obesity in pregnancy 12/16/2018       Plan:     Initial labs drawn. Prenatal vitamins. Problem list reviewed and updated. Follow up in 4 weeks. Babyscripts, BP cuff, and she has a scale at home. Come back for NIPS in 2-3 week Virtual visit in 4 weeks Start baby asa at 13 weeks Check comp meta panel and pr/cr Rec less than 20 pound weight gain total.   Sarah Villanueva 12/16/2018

## 2018-12-16 NOTE — Progress Notes (Signed)
Bedside U/S shows single IUP with FHT of 168 BPM and CRL measures 35.87 mm  GA is [redacted]w[redacted]d

## 2018-12-17 LAB — OBSTETRIC PANEL
Absolute Monocytes: 538 cells/uL (ref 200–950)
Antibody Screen: NOT DETECTED
Basophils Absolute: 11 cells/uL (ref 0–200)
Basophils Relative: 0.1 %
Eosinophils Absolute: 67 cells/uL (ref 15–500)
Eosinophils Relative: 0.6 %
HCT: 37.3 % (ref 35.0–45.0)
Hemoglobin: 12 g/dL (ref 11.7–15.5)
Hepatitis B Surface Ag: NONREACTIVE
Lymphs Abs: 2139 cells/uL (ref 850–3900)
MCH: 27 pg (ref 27.0–33.0)
MCHC: 32.2 g/dL (ref 32.0–36.0)
MCV: 83.8 fL (ref 80.0–100.0)
MPV: 9.7 fL (ref 7.5–12.5)
Monocytes Relative: 4.8 %
Neutro Abs: 8445 cells/uL — ABNORMAL HIGH (ref 1500–7800)
Neutrophils Relative %: 75.4 %
Platelets: 334 10*3/uL (ref 140–400)
RBC: 4.45 10*6/uL (ref 3.80–5.10)
RDW: 13 % (ref 11.0–15.0)
RPR Ser Ql: NONREACTIVE
Rubella: <0.9 index — ABNORMAL LOW
Total Lymphocyte: 19.1 %
WBC: 11.2 10*3/uL — ABNORMAL HIGH (ref 3.8–10.8)

## 2018-12-17 LAB — COMPREHENSIVE METABOLIC PANEL
AG Ratio: 1.3 (calc) (ref 1.0–2.5)
ALT: 11 U/L (ref 6–29)
AST: 14 U/L (ref 10–30)
Albumin: 4 g/dL (ref 3.6–5.1)
Alkaline phosphatase (APISO): 46 U/L (ref 31–125)
BUN: 11 mg/dL (ref 7–25)
CO2: 27 mmol/L (ref 20–32)
Calcium: 9.9 mg/dL (ref 8.6–10.2)
Chloride: 103 mmol/L (ref 98–110)
Creat: 0.69 mg/dL (ref 0.50–1.10)
Globulin: 3 g/dL (calc) (ref 1.9–3.7)
Glucose, Bld: 88 mg/dL (ref 65–99)
Potassium: 4 mmol/L (ref 3.5–5.3)
Sodium: 137 mmol/L (ref 135–146)
Total Bilirubin: 0.5 mg/dL (ref 0.2–1.2)
Total Protein: 7 g/dL (ref 6.1–8.1)

## 2018-12-17 LAB — HEMOGLOBIN A1C
Hgb A1c MFr Bld: 5.5 % of total Hgb (ref <5.7)
Mean Plasma Glucose: 111 (calc)
eAG (mmol/L): 6.2 (calc)

## 2018-12-17 LAB — PROTEIN / CREATININE RATIO, URINE
Creatinine, Urine: 96 mg/dL (ref 20–275)
Protein/Creat Ratio: 115 mg/g creat (ref 21–161)
Protein/Creatinine Ratio: 0.115 mg/mg creat (ref 0.021–0.16)
Total Protein, Urine: 11 mg/dL (ref 5–24)

## 2018-12-17 LAB — HIV ANTIBODY (ROUTINE TESTING W REFLEX): HIV 1&2 Ab, 4th Generation: NONREACTIVE

## 2018-12-18 LAB — CERVICOVAGINAL ANCILLARY ONLY
Chlamydia: NEGATIVE
Neisseria Gonorrhea: NEGATIVE

## 2018-12-20 LAB — CULTURE, OB URINE

## 2018-12-20 LAB — URINE CULTURE, OB REFLEX

## 2018-12-29 ENCOUNTER — Ambulatory Visit (INDEPENDENT_AMBULATORY_CARE_PROVIDER_SITE_OTHER): Payer: 59 | Admitting: *Deleted

## 2018-12-29 ENCOUNTER — Other Ambulatory Visit: Payer: Self-pay

## 2018-12-29 DIAGNOSIS — N898 Other specified noninflammatory disorders of vagina: Secondary | ICD-10-CM

## 2018-12-29 DIAGNOSIS — Z3480 Encounter for supervision of other normal pregnancy, unspecified trimester: Secondary | ICD-10-CM

## 2018-12-29 LAB — TIQ-MISC

## 2018-12-29 NOTE — Progress Notes (Signed)
Pt here for Nipts and states that she has had a vaginal breakout with C/O's of itching and burning since her last appt.  I examined the area she was describing and there are ulcerative lesions around the vaginal area.  A viral culture was obtained and bloodwork for HSV 1 and 2 Igg and Igm were drawn.  Pt aware that the HSV will take a few days to return and the Panorama will take around 10 business days.

## 2018-12-30 ENCOUNTER — Ambulatory Visit: Payer: 59

## 2018-12-31 LAB — FETAL FIBRONECTIN

## 2018-12-31 LAB — HERPES SIMPLEX VIRUS CULTURE

## 2018-12-31 LAB — HSV(HERPES SMPLX)ABS-I+II(IGG+IGM)-BLD
HSV 1 Glycoprotein G Ab, IgG: 28.3 index — ABNORMAL HIGH (ref 0.00–0.90)
HSV 2 IgG, Type Spec: <0.91 index (ref 0.00–0.90)
HSVI/II Comb IgM: 1.26 Ratio — ABNORMAL HIGH (ref 0.00–0.90)

## 2019-01-05 ENCOUNTER — Encounter: Payer: Self-pay | Admitting: Obstetrics & Gynecology

## 2019-01-05 ENCOUNTER — Telehealth: Payer: Self-pay

## 2019-01-05 DIAGNOSIS — Z789 Other specified health status: Secondary | ICD-10-CM | POA: Insufficient documentation

## 2019-01-05 NOTE — Telephone Encounter (Signed)
Spoke with pt and she is aware of low risk genetic testing results and that predicted fetal sex is female.

## 2019-01-06 ENCOUNTER — Other Ambulatory Visit: Payer: Self-pay

## 2019-01-06 DIAGNOSIS — Z141 Cystic fibrosis carrier: Secondary | ICD-10-CM

## 2019-01-06 NOTE — Progress Notes (Signed)
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

## 2019-01-13 ENCOUNTER — Telehealth (INDEPENDENT_AMBULATORY_CARE_PROVIDER_SITE_OTHER): Payer: 59 | Admitting: Obstetrics & Gynecology

## 2019-01-13 DIAGNOSIS — Z789 Other specified health status: Secondary | ICD-10-CM

## 2019-01-13 DIAGNOSIS — O99210 Obesity complicating pregnancy, unspecified trimester: Secondary | ICD-10-CM

## 2019-01-13 DIAGNOSIS — O99212 Obesity complicating pregnancy, second trimester: Secondary | ICD-10-CM

## 2019-01-13 DIAGNOSIS — Z3490 Encounter for supervision of normal pregnancy, unspecified, unspecified trimester: Secondary | ICD-10-CM

## 2019-01-13 DIAGNOSIS — Z3A14 14 weeks gestation of pregnancy: Secondary | ICD-10-CM

## 2019-01-13 NOTE — Progress Notes (Signed)
   [place] VIRTUAL VIDEO VISIT ENCOUNTER NOTE  Provider location: [facility] for [facility] at [place]   I connected with Erick Alley on 01/13/19 at  2:15 PM EDT by MyChart Video Encounter at home and verified that I am speaking with the correct person using two identifiers.   I discussed the limitations, risks, security and privacy concerns of performing an evaluation and management service virtually and the availability of in person appointments. I also discussed with the patient that there may be a patient responsible charge related to this service. The patient expressed understanding and agreed to proceed. Subjective:  Sarah Villanueva is a 28 y.o. G2P0010 at [redacted]w[redacted]d being seen today for ongoing prenatal care.  She is currently monitored for the following issues for this low-risk pregnancy and has Supervision of normal pregnancy; Obesity in pregnancy; and Not immune to rubella on their problem list.  Patient reports no complaints.  Contractions: Not present. Vag. Bleeding: None.  Movement: Absent. Denies any leaking of fluid.   The following portions of the patient's history were reviewed and updated as appropriate: allergies, current medications, past family history, past medical history, past social history, past surgical history and problem list.   Objective:   Vitals:   01/13/19 1333  BP: 108/70  Pulse: 84  Weight: 213 lb (96.6 kg)    Fetal Status:     Movement: Absent     General:  Alert, oriented and cooperative. Patient is in no acute distress.  Respiratory: Normal respiratory effort, no problems with respiration noted  Mental Status: Normal mood and affect. Normal behavior. Normal judgment and thought content.  Rest of physical exam deferred due to type of encounter  Imaging: No results found.  Assessment and Plan:  Pregnancy: G2P0010 at [redacted]w[redacted]d 1. Not immune to rubella - immunize postpartum  2. Encounter for supervision of normal  pregnancy, antepartum, unspecified gravidity - she was made aware that ACOG rec's the flu vaccine  3. Obesity in pregnancy - normal HBA1C  4. CF carrier- we discussed this today. A referral has been made for MFM consult. Preterm labor symptoms and general obstetric precautions including but not limited to vaginal bleeding, contractions, leaking of fluid and fetal movement were reviewed in detail with the patient. I discussed the assessment and treatment plan with the patient. The patient was provided an opportunity to ask questions and all were answered. The patient agreed with the plan and demonstrated an understanding of the instructions. The patient was advised to call back or seek an in-person office evaluation/go to [facility] at [facility] for any urgent or concerning symptoms. Please refer to After Visit Summary for other counseling recommendations.   I provided 10 minutes of face-to-face time during this encounter.  Return for at 20 weeks in person.  Future Appointments  Date Time Provider [place]  02/14/2019  2:30 PM [facility] [place] 1 [facility]    Everlena Mackley C Jess Sulak, [place] for [facility], [place]

## 2019-02-14 ENCOUNTER — Other Ambulatory Visit (HOSPITAL_COMMUNITY): Payer: Self-pay | Admitting: *Deleted

## 2019-02-14 ENCOUNTER — Ambulatory Visit (HOSPITAL_COMMUNITY)
Admission: RE | Admit: 2019-02-14 | Discharge: 2019-02-14 | Disposition: A | Discharge status: Home / Self Care | Payer: Medicaid Other | Source: Ambulatory Visit | Attending: Obstetrics & Gynecology | Admitting: Obstetrics & Gynecology

## 2019-02-14 ENCOUNTER — Other Ambulatory Visit: Payer: Self-pay | Admitting: Obstetrics & Gynecology

## 2019-02-14 ENCOUNTER — Other Ambulatory Visit: Payer: Self-pay

## 2019-02-14 DIAGNOSIS — O09892 Supervision of other high risk pregnancies, second trimester: Secondary | ICD-10-CM

## 2019-02-14 DIAGNOSIS — O99212 Obesity complicating pregnancy, second trimester: Secondary | ICD-10-CM

## 2019-02-14 DIAGNOSIS — Z3689 Encounter for other specified antenatal screening: Secondary | ICD-10-CM

## 2019-02-14 DIAGNOSIS — Z3A18 18 weeks gestation of pregnancy: Secondary | ICD-10-CM | POA: Diagnosis not present

## 2019-02-14 DIAGNOSIS — Z362 Encounter for other antenatal screening follow-up: Secondary | ICD-10-CM

## 2019-02-14 DIAGNOSIS — Z3490 Encounter for supervision of normal pregnancy, unspecified, unspecified trimester: Secondary | ICD-10-CM | POA: Diagnosis not present

## 2019-02-22 ENCOUNTER — Other Ambulatory Visit: Payer: Self-pay

## 2019-02-22 ENCOUNTER — Ambulatory Visit (INDEPENDENT_AMBULATORY_CARE_PROVIDER_SITE_OTHER): Payer: Medicaid Other | Admitting: Advanced Practice Midwife

## 2019-02-22 VITALS — BP 113/66 | HR 88 | Wt 228.0 lb

## 2019-02-22 DIAGNOSIS — Z3A20 20 weeks gestation of pregnancy: Secondary | ICD-10-CM

## 2019-02-22 DIAGNOSIS — Z3482 Encounter for supervision of other normal pregnancy, second trimester: Secondary | ICD-10-CM

## 2019-02-22 DIAGNOSIS — Z141 Cystic fibrosis carrier: Secondary | ICD-10-CM | POA: Insufficient documentation

## 2019-02-22 DIAGNOSIS — Z3480 Encounter for supervision of other normal pregnancy, unspecified trimester: Secondary | ICD-10-CM

## 2019-02-22 NOTE — Progress Notes (Signed)
   PRENATAL VISIT NOTE  Subjective:  Sarah Villanueva is a 28 y.o. G2P0010 at [redacted]w[redacted]d being seen today for ongoing prenatal care.  She is currently monitored for the following issues for this low-risk pregnancy and has Supervision of normal pregnancy; Obesity in pregnancy; and Not immune to rubella on their problem list.  Patient has no complaints today.  Contractions: Regular. Vag. Bleeding: None.  Movement: Present for last two weeks. Denies leaking of fluid.   The following portions of the patient's history were reviewed and updated as appropriate: allergies, current medications, past family history, past medical history, past social history, past surgical history and problem list.   Objective:   Vitals:   02/22/19 1303  BP: 113/66  Pulse: 88  Weight: 103.4 kg    Fetal Status: Fetal Heart Rate (bpm): 140   Movement: Present     General:  Alert, oriented and cooperative. Patient is in no acute distress.  Skin: Skin is warm and dry. No rash noted.   Cardiovascular: Normal heart rate noted  Respiratory: Normal respiratory effort, no problems with respiration noted  Abdomen: Soft, gravid, appropriate for gestational age.  Pain/Pressure: Absent  Uterine fundus palpable at umbilicus.   Pelvic: Cervical exam deferred        Extremities: Normal range of motion.  Edema: None  Mental Status: Normal mood and affect. Normal behavior. Normal judgment and thought content.   Assessment and Plan:  Pregnancy: G2P0010 at [redacted]w[redacted]d There are no diagnoses linked to this encounter. Preterm labor symptoms and general obstetric precautions including but not limited to vaginal bleeding, contractions, leaking of fluid and fetal movement were reviewed in detail with the patient. Anticipatory guidance regarding next visit given.  Please refer to After Visit Summary for other counseling recommendations.   1. Supervision of other normal pregnancy, antepartum - AFP collected today.  2. Cystic fibrosis carrier  - Patient received educational counseling given at previous visit. Patient and her partner decline CF carrier testing for her partner at this time.  No follow-ups on file.  Future Appointments  Date Time Provider [place]  02/22/2019  1:15 PM Simona Huh [facility]  03/28/2019  2:45 PM [place]    Hessie Dibble, Student-PA

## 2019-02-23 LAB — ALPHA FETOPROTEIN, MATERNAL
AFP MoM: 1.31
AFP, Serum: 60.5 ng/mL
Calc'd Gestational Age: 20 weeks
Maternal Wt: 213 [lb_av]
Risk for ONTD: 1
Twins-AFP: 1

## 2019-02-23 LAB — TIQ-AOE

## 2019-03-01 ENCOUNTER — Encounter: Payer: Self-pay | Admitting: Certified Nurse Midwife

## 2019-03-01 ENCOUNTER — Ambulatory Visit (INDEPENDENT_AMBULATORY_CARE_PROVIDER_SITE_OTHER): Payer: Medicaid Other | Admitting: Certified Nurse Midwife

## 2019-03-01 ENCOUNTER — Other Ambulatory Visit (HOSPITAL_COMMUNITY)
Admission: RE | Admit: 2019-03-01 | Discharge: 2019-03-01 | Disposition: A | Discharge status: Home / Self Care | Payer: Medicaid Other | Source: Ambulatory Visit | Attending: Certified Nurse Midwife | Admitting: Certified Nurse Midwife

## 2019-03-01 ENCOUNTER — Other Ambulatory Visit: Payer: Self-pay

## 2019-03-01 VITALS — BP 110/68 | HR 70 | Temp 98.3°F | Wt 231.0 lb

## 2019-03-01 DIAGNOSIS — O99210 Obesity complicating pregnancy, unspecified trimester: Secondary | ICD-10-CM

## 2019-03-01 DIAGNOSIS — Z3480 Encounter for supervision of other normal pregnancy, unspecified trimester: Secondary | ICD-10-CM | POA: Insufficient documentation

## 2019-03-01 DIAGNOSIS — O23592 Infection of other part of genital tract in pregnancy, second trimester: Secondary | ICD-10-CM

## 2019-03-01 DIAGNOSIS — A5901 Trichomonal vulvovaginitis: Secondary | ICD-10-CM

## 2019-03-01 DIAGNOSIS — Z3A21 21 weeks gestation of pregnancy: Secondary | ICD-10-CM

## 2019-03-01 DIAGNOSIS — O99212 Obesity complicating pregnancy, second trimester: Secondary | ICD-10-CM

## 2019-03-01 MED ORDER — COMFORT FIT MATERNITY SUPP LG MISC: 1.0000 [IU] | Freq: Every day | 0 refills | Status: DC

## 2019-03-01 NOTE — Progress Notes (Signed)
   PRENATAL VISIT NOTE- PROBLEM VISIT  Subjective:  Sarah Villanueva is a 28 y.o. G2P0010 at [redacted]w[redacted]d being seen today for ongoing prenatal care.  She is currently monitored for the following issues for this low-risk pregnancy and has Supervision of normal pregnancy; Obesity in pregnancy; Not immune to rubella; and Cystic fibrosis carrier on their problem list.  Patient reports abdominal pain.  Contractions: Not present. Vag. Bleeding: None.  Movement: Present. Denies leaking of fluid.   The following portions of the patient's history were reviewed and updated as appropriate: allergies, current medications, past family history, past medical history, past social history, past surgical history and problem list.   Objective:   Vitals:   03/01/19 1045  BP: 110/68  Pulse: 70  Temp: 98.3 F (36.8 C)  Weight: 231 lb (104.8 kg)    Fetal Status: Fetal Heart Rate (bpm): 141 Fundal Height: 24 cm Movement: Present     General:  Alert, oriented and cooperative. Patient is in no acute distress.  Skin: Skin is warm and dry. No rash noted.   Cardiovascular: Normal heart rate noted  Respiratory: Normal respiratory effort, no problems with respiration noted  Abdomen: Soft, gravid, appropriate for gestational age.  Pain/Pressure: Present     Pelvic: Cervical exam performed Dilation: Closed Effacement (%): Thick Station: Ballotable  Extremities: Normal range of motion.  Edema: None  Mental Status: Normal mood and affect. Normal behavior. Normal judgment and thought content.   Assessment and Plan:  Pregnancy: G2P0010 at [redacted]w[redacted]d 1. Supervision of other normal pregnancy, antepartum - Patient being seen today for problem visit and complaints of abdominal pain  - She describes abdominal pain as cramping that is "all over", rates pain 8/10 when it occurs - reports pain is better today after rest, has not taken any medication for abdominal pain - She denies IC, vaginal bleeding, or vaginal discharge  - FFN  collected prior to examination  - Cervix closed/think - FFN not sent  - Educated and discussed use of tylenol, rest, ice and support belt for occasional abdominal pain.  - Elastic Bandages & Supports (COMFORT FIT MATERNITY SUPP LG) MISC; 1 Units by Does not apply route daily.  Dispense: 1 each; Refill: 0 - Cervicovaginal ancillary only( [place])  2. Obesity in pregnancy   Preterm labor symptoms and general obstetric precautions including but not limited to vaginal bleeding, contractions, leaking of fluid and fetal movement were reviewed in detail with the patient. Please refer to After Visit Summary for other counseling recommendations.    Future Appointments  Date Time Provider [place]  03/28/2019  2:45 PM [place] 2 [facility]-US  04/05/2019  8:15 AM Left[place]-Kirby, Kathie Dike, CNM [facility]    Lajean Manes, CNM

## 2019-03-01 NOTE — Patient Instructions (Signed)

## 2019-03-01 NOTE — Progress Notes (Signed)
Abd pain, pt states it goes all around abd

## 2019-03-02 LAB — CERVICOVAGINAL ANCILLARY ONLY
Bacterial Vaginitis (gardnerella): NEGATIVE
Candida Glabrata: NEGATIVE
Candida Vaginitis: NEGATIVE
Chlamydia: NEGATIVE
Comment: NEGATIVE
Comment: NEGATIVE
Comment: NEGATIVE
Comment: NEGATIVE
Comment: NEGATIVE
Comment: NORMAL
Neisseria Gonorrhea: NEGATIVE
Trichomonas: POSITIVE — AB

## 2019-03-07 ENCOUNTER — Encounter: Payer: Self-pay | Admitting: Certified Nurse Midwife

## 2019-03-07 DIAGNOSIS — O23599 Infection of other part of genital tract in pregnancy, unspecified trimester: Secondary | ICD-10-CM | POA: Insufficient documentation

## 2019-03-07 DIAGNOSIS — O98519 Other viral diseases complicating pregnancy, unspecified trimester: Secondary | ICD-10-CM | POA: Insufficient documentation

## 2019-03-07 DIAGNOSIS — B009 Herpesviral infection, unspecified: Secondary | ICD-10-CM | POA: Insufficient documentation

## 2019-03-07 DIAGNOSIS — A5901 Trichomonal vulvovaginitis: Secondary | ICD-10-CM | POA: Insufficient documentation

## 2019-03-07 MED ORDER — METRONIDAZOLE 500 MG PO TABS: 500.0000 mg | ORAL_TABLET | Freq: Two times a day (BID) | ORAL | 0 refills | Status: DC

## 2019-03-07 NOTE — Addendum Note (Signed)
Addended by: Lajean Manes on: 03/07/2019 08:37 AM   Modules accepted: Orders

## 2019-03-28 ENCOUNTER — Other Ambulatory Visit: Payer: Self-pay

## 2019-03-28 ENCOUNTER — Ambulatory Visit (HOSPITAL_COMMUNITY)
Admission: RE | Admit: 2019-03-28 | Discharge: 2019-03-28 | Disposition: A | Discharge status: Home / Self Care | Payer: Medicaid Other | Source: Ambulatory Visit | Attending: Obstetrics and Gynecology | Admitting: Obstetrics and Gynecology

## 2019-03-28 ENCOUNTER — Other Ambulatory Visit (HOSPITAL_COMMUNITY): Payer: Self-pay | Admitting: *Deleted

## 2019-03-28 DIAGNOSIS — Z3689 Encounter for other specified antenatal screening: Secondary | ICD-10-CM

## 2019-03-28 DIAGNOSIS — Z3A24 24 weeks gestation of pregnancy: Secondary | ICD-10-CM | POA: Diagnosis not present

## 2019-03-28 DIAGNOSIS — O09892 Supervision of other high risk pregnancies, second trimester: Secondary | ICD-10-CM

## 2019-03-28 DIAGNOSIS — Z362 Encounter for other antenatal screening follow-up: Secondary | ICD-10-CM | POA: Diagnosis present

## 2019-03-28 DIAGNOSIS — O99212 Obesity complicating pregnancy, second trimester: Secondary | ICD-10-CM

## 2019-04-05 ENCOUNTER — Ambulatory Visit (INDEPENDENT_AMBULATORY_CARE_PROVIDER_SITE_OTHER): Payer: Medicaid Other | Admitting: Advanced Practice Midwife

## 2019-04-05 ENCOUNTER — Other Ambulatory Visit: Payer: Self-pay

## 2019-04-05 ENCOUNTER — Other Ambulatory Visit (HOSPITAL_COMMUNITY)
Admission: RE | Admit: 2019-04-05 | Discharge: 2019-04-05 | Disposition: A | Discharge status: Home / Self Care | Payer: Medicaid Other | Source: Ambulatory Visit | Attending: Advanced Practice Midwife | Admitting: Advanced Practice Midwife

## 2019-04-05 VITALS — BP 110/70 | HR 88 | Wt 243.0 lb

## 2019-04-05 DIAGNOSIS — Z3480 Encounter for supervision of other normal pregnancy, unspecified trimester: Secondary | ICD-10-CM

## 2019-04-05 DIAGNOSIS — O23592 Infection of other part of genital tract in pregnancy, second trimester: Secondary | ICD-10-CM | POA: Diagnosis present

## 2019-04-05 DIAGNOSIS — A5901 Trichomonal vulvovaginitis: Secondary | ICD-10-CM | POA: Insufficient documentation

## 2019-04-05 DIAGNOSIS — Z3A26 26 weeks gestation of pregnancy: Secondary | ICD-10-CM

## 2019-04-05 MED ORDER — METRONIDAZOLE 500 MG PO TABS: ORAL_TABLET | ORAL | 0 refills | Status: DC

## 2019-04-05 NOTE — Patient Instructions (Addendum)
Expedited Partner Therapy:  Information Sheet for Patients and Partners               You have been offered expedited partner therapy (EPT). This information sheet contains important information and warnings you need to be aware of, so please read it carefully.   Expedited Partner Therapy (EPT) is the clinical practice of treating the sexual partners of persons who receive chlamydia, gonorrhea, or trichomoniasis diagnoses by providing medications or prescriptions to the patient. Patients then provide partners with these therapies without the health-care provider having examined the partner. In other words, EPT is a convenient, fast and private way for patients to help their sexual partners get treated.   Chlamydia and gonorrhea are bacterial infections you get from having sex with a person who is already infected. Trichomoniasis (or "trich") is a very common sexually transmitted infection (STI) that is caused by infection with a protozoan parasite called Trichomonas vaginalis.  Many people with these infections don't know it because they feel fine, but without treatment these infections can cause serious health problems, such as pelvic inflammatory disease, ectopic pregnancy, infertility and increased risk of HIV.   It is important to get treated as soon as possible to protect your health, to avoid spreading these infections to others, and to prevent yourself from becoming re-infected. The good news is these infections can be easily cured with proper antibiotic medicine. The best way to take care of your self is to see a doctor or go to your local health department. If you are not able to see a doctor or other medical provider, you should take EPT.    Recommended Medication: EPT for Chlamydia:  Azithromycin (Zithromax) 1 gram orally in a single dose EPT for Gonorrhea:  Cefixime (Suprax) 400 milligrams orally in a single dose PLUS azithromycin (Zithromax) 1 gram orally in a single dose EPT for  Trichomoniasis:  Metronidazole (Flagyl) 2 grams orally in a single dose   These medicines are very safe. However, you should not take them if you have ever had an allergic reaction (like a rash) to any of these medicines: azithromycin (Zithromax), erythromycin, clarithromycin (Biaxin), metronidazole (Flagyl), tinidazole (Tindimax). If you are uncertain about whether you have an allergy, call your medical provider or pharmacist before taking this medicine. If you have a serious, long-term illness like kidney, liver or heart disease, colitis or stomach problems, or you are currently taking other prescription medication, talk to your provider before taking this medication.   Women: If you have lower belly pain, pain during sex, vomiting, or a fever, do not take this medicine. Instead, you should see a medical provider to be certain you do not have pelvic inflammatory disease (PID). PID can be serious and lead to infertility, pregnancy problems or chronic pelvic pain.   Pregnant Women: It is very important for you to see a doctor to get pregnancy services and pre-natal care. These antibiotics for EPT are safe for pregnant women, but you still need to see a medical provider as soon as possible. It is also important to note that Doxycycline is an alternative therapy for chlamydia, but it should not be taken by someone who is pregnant.   Men: If you have pain or swelling in the testicles or a fever, do not take this medicine and see a medical provider.     Men who have sex with men (MSM): MSM in [place] continue to experience high rates of syphilis and HIV. Many MSM with gonorrhea or  chlamydia could also have syphilis and/or HIV and not know it. If you are a man who has sex with other men, it is very important that you see a medical provider and are tested for HIV and syphilis. EPT is not recommended for gonorrhea for MSM.  Recommended treatment for gonorrhea for MSM is Rocephin (shot) AND azithromycin  due to decreased cure rate.  Please see your medical provider if this is the case.    Along with this information sheet is a prescription for the medicine. If you receive a prescription it will be in your name and will indicate your date of birth, or it will be in the name of "Expedited Partner Therapy".   In either case, you can have the prescription filled at a pharmacy. You will be responsible for the cost of the medicine, unless you have prescription drug coverage. In that case, you could provide your name so the pharmacy could bill your health plan.   Take the medication as directed. Some people will have a mild, upset stomach, which does not last long. AVOID alcohol 24 hours after taking metronidazole (Flagyl) to reduce the possibility of a disulfiram-like reaction (severe vomiting and abdominal pain).  After taking the medicine, do not have sex for 7 days. Do not share this medicine or give it to anyone else. It is important to tell everyone you have had sex with in the last 60 days that they need to go and get tested for sexually transmitted infections.   Ways to prevent these and other sexually transmitted infections (STIs):   Marland Kitchen Abstain from sex. This is the only sure way to avoid getting an STI.  Marland Kitchen Use barrier methods, such as condoms, consistently and correctly.  . Limit the number of sexual partners.  . Have regular physical exams, including testing for STIs.   For more information about EPT or other issues pertaining to an STI, please contact your medical provider or the [facility] Department at [phone] or [email or web address]    Third Trimester of Pregnancy The third trimester is from week 28 through week 40 (months 7 through 9). The third trimester is a time when the unborn baby (fetus) is growing rapidly. At the end of the ninth month, the fetus is about 20 inches in length and weighs 6-10  pounds. Body changes during your third trimester Your body will continue to go through many changes during pregnancy. The changes vary from woman to woman. During the third trimester:  Your weight will continue to increase. You can expect to gain 25-35 pounds (11-16 kg) by the end of the pregnancy.  You may begin to get stretch marks on your hips, abdomen, and breasts.  You may urinate more often because the fetus is moving lower into your pelvis and pressing on your bladder.  You may develop or continue to have heartburn. This is caused by increased hormones that slow down muscles in the digestive tract.  You may develop or continue to have constipation because increased hormones slow digestion and cause the muscles that push waste through your intestines to relax.  You may develop hemorrhoids. These are swollen veins (varicose veins) in the rectum that can itch or be painful.  You may develop swollen, bulging veins (varicose veins) in your legs.  You may have increased body aches in the pelvis, back, or thighs. This is due to weight gain and increased hormones that are relaxing your joints.  You may have changes in your  hair. These can include thickening of your hair, rapid growth, and changes in texture. Some women also have hair loss during or after pregnancy, or hair that feels dry or thin. Your hair will most likely return to normal after your baby is born.  Your breasts will continue to grow and they will continue to become tender. A yellow fluid (colostrum) may leak from your breasts. This is the first milk you are producing for your baby.  Your belly button may stick out.  You may notice more swelling in your hands, face, or ankles.  You may have increased tingling or numbness in your hands, arms, and legs. The skin on your belly may also feel numb.  You may feel short of breath because of your expanding uterus.  You may have more problems sleeping. This can be caused by the  size of your belly, increased need to urinate, and an increase in your body's metabolism.  You may notice the fetus "dropping," or moving lower in your abdomen (lightening).  You may have increased vaginal discharge.  You may notice your joints feel loose and you may have pain around your pelvic bone. What to expect at prenatal visits You will have prenatal exams every 2 weeks until week 36. Then you will have weekly prenatal exams. During a routine prenatal visit:  You will be weighed to make sure you and the baby are growing normally.  Your blood pressure will be taken.  Your abdomen will be measured to track your baby's growth.  The fetal heartbeat will be listened to.  Any test results from the previous visit will be discussed.  You may have a cervical check near your due date to see if your cervix has softened or thinned (effaced).  You will be tested for Group B streptococcus. This happens between 35 and 37 weeks. Your health care provider may ask you:  What your birth plan is.  How you are feeling.  If you are feeling the baby move.  If you have had any abnormal symptoms, such as leaking fluid, bleeding, severe headaches, or abdominal cramping.  If you are using any tobacco products, including cigarettes, chewing tobacco, and electronic cigarettes.  If you have any questions. Other tests or screenings that may be performed during your third trimester include:  Blood tests that check for low iron levels (anemia).  Fetal testing to check the health, activity level, and growth of the fetus. Testing is done if you have certain medical conditions or if there are problems during the pregnancy.  Nonstress test (NST). This test checks the health of your baby to make sure there are no signs of problems, such as the baby not getting enough oxygen. During this test, a belt is placed around your belly. The baby is made to move, and its heart rate is monitored during  movement. What is false labor? False labor is a condition in which you feel small, irregular tightenings of the muscles in the womb (contractions) that usually go away with rest, changing position, or drinking water. These are called Braxton Hicks contractions. Contractions may last for hours, days, or even weeks before true labor sets in. If contractions come at regular intervals, become more frequent, increase in intensity, or become painful, you should see your health care provider. What are the signs of labor?  Abdominal cramps.  Regular contractions that start at 10 minutes apart and become stronger and more frequent with time.  Contractions that start on the top of  the uterus and spread down to the lower abdomen and back.  Increased pelvic pressure and dull back pain.  A watery or bloody mucus discharge that comes from the vagina.  Leaking of amniotic fluid. This is also known as your "water breaking." It could be a slow trickle or a gush. Let your health care provider know if it has a color or strange odor. If you have any of these signs, call your health care provider right away, even if it is before your due date. Follow these instructions at home: Medicines  Follow your health care provider's instructions regarding medicine use. Specific medicines may be either safe or unsafe to take during pregnancy.  Take a prenatal vitamin that contains at least 600 micrograms (mcg) of folic acid.  If you develop constipation, try taking a stool softener if your health care provider approves. Eating and drinking   Eat a balanced diet that includes fresh fruits and vegetables, whole grains, good sources of protein such as meat, eggs, or tofu, and low-fat dairy. Your health care provider will help you determine the amount of weight gain that is right for you.  Avoid raw meat and uncooked cheese. These carry germs that can cause birth defects in the baby.  If you have low calcium intake from  food, talk to your health care provider about whether you should take a daily calcium supplement.  Eat four or five small meals rather than three large meals a day.  Limit foods that are high in fat and processed sugars, such as fried and sweet foods.  To prevent constipation: ? Drink enough fluid to keep your urine clear or pale yellow. ? Eat foods that are high in fiber, such as fresh fruits and vegetables, whole grains, and beans. Activity  Exercise only as directed by your health care provider. Most women can continue their usual exercise routine during pregnancy. Try to exercise for 30 minutes at least 5 days a week. Stop exercising if you experience uterine contractions.  Avoid heavy lifting.  Do not exercise in extreme heat or humidity, or at high altitudes.  Wear low-heel, comfortable shoes.  Practice good posture.  You may continue to have sex unless your health care provider tells you otherwise. Relieving pain and discomfort  Take frequent breaks and rest with your legs elevated if you have leg cramps or low back pain.  Take warm sitz baths to soothe any pain or discomfort caused by hemorrhoids. Use hemorrhoid cream if your health care provider approves.  Wear a good support bra to prevent discomfort from breast tenderness.  If you develop varicose veins: ? Wear support pantyhose or compression stockings as told by your healthcare provider. ? Elevate your feet for 15 minutes, 3-4 times a day. Prenatal care  Write down your questions. Take them to your prenatal visits.  Keep all your prenatal visits as told by your health care provider. This is important. Safety  Wear your seat belt at all times when driving.  Make a list of emergency phone numbers, including numbers for family, friends, the hospital, and police and fire departments. General instructions  Avoid cat litter boxes and soil used by cats. These carry germs that can cause birth defects in the baby. If  you have a cat, ask someone to clean the litter box for you.  Do not travel far distances unless it is absolutely necessary and only with the approval of your health care provider.  Do not use hot tubs, steam rooms,  or saunas.  Do not drink alcohol.  Do not use any products that contain nicotine or tobacco, such as cigarettes and e-cigarettes. If you need help quitting, ask your health care provider.  Do not use any medicinal herbs or unprescribed drugs. These chemicals affect the formation and growth of the baby.  Do not douche or use tampons or scented sanitary pads.  Do not cross your legs for long periods of time.  To prepare for the arrival of your baby: ? Take prenatal classes to understand, practice, and ask questions about labor and delivery. ? Make a trial run to the hospital. ? Visit the hospital and tour the maternity area. ? Arrange for maternity or paternity leave through employers. ? Arrange for family and friends to take care of pets while you are in the hospital. ? Purchase a rear-facing car seat and make sure you know how to install it in your car. ? Pack your hospital bag. ? Prepare the baby's nursery. Make sure to remove all pillows and stuffed animals from the baby's crib to prevent suffocation.  Visit your dentist if you have not gone during your pregnancy. Use a soft toothbrush to brush your teeth and be gentle when you floss. Contact a health care provider if:  You are unsure if you are in labor or if your water has broken.  You become dizzy.  You have mild pelvic cramps, pelvic pressure, or nagging pain in your abdominal area.  You have lower back pain.  You have persistent nausea, vomiting, or diarrhea.  You have an unusual or bad smelling vaginal discharge.  You have pain when you urinate. Get help right away if:  Your water breaks before 37 weeks.  You have regular contractions less than 5 minutes apart before 37 weeks.  You have a  fever.  You are leaking fluid from your vagina.  You have spotting or bleeding from your vagina.  You have severe abdominal pain or cramping.  You have rapid weight loss or weight gain.  You have shortness of breath with chest pain.  You notice sudden or extreme swelling of your face, hands, ankles, feet, or legs.  Your baby makes fewer than 10 movements in 2 hours.  You have severe headaches that do not go away when you take medicine.  You have vision changes. Summary  The third trimester is from week 28 through week 40, months 7 through 9. The third trimester is a time when the unborn baby (fetus) is growing rapidly.  During the third trimester, your discomfort may increase as you and your baby continue to gain weight. You may have abdominal, leg, and back pain, sleeping problems, and an increased need to urinate.  During the third trimester your breasts will keep growing and they will continue to become tender. A yellow fluid (colostrum) may leak from your breasts. This is the first milk you are producing for your baby.  False labor is a condition in which you feel small, irregular tightenings of the muscles in the womb (contractions) that eventually go away. These are called Braxton Hicks contractions. Contractions may last for hours, days, or even weeks before true labor sets in.  Signs of labor can include: abdominal cramps; regular contractions that start at 10 minutes apart and become stronger and more frequent with time; watery or bloody mucus discharge that comes from the vagina; increased pelvic pressure and dull back pain; and leaking of amniotic fluid. This information is not intended to replace advice  given to you by your health care provider. Make sure you discuss any questions you have with your health care provider. Document Released: 03/18/2001 Document Revised: 07/15/2018 Document Reviewed: 04/29/2016 Elsevier Patient Education  2020 [employer].

## 2019-04-05 NOTE — Progress Notes (Signed)
   PRENATAL VISIT NOTE  Subjective:  Sarah Villanueva is a 28 y.o. G2P0010 at [redacted]w[redacted]d being seen today for ongoing prenatal care. She is currently monitored for the following issues for this low-risk pregnancy and has Supervision of normal pregnancy; Obesity in pregnancy; Not immune to rubella; Cystic fibrosis carrier; Herpes simplex infection during pregnancy; and Trichomonal vaginitis in pregnancy on their problem list.  Patient reports no complaints.  Contractions: Not present. Vag. Bleeding: None.  Movement: Present. Denies leaking of fluid.   The following portions of the patient's history were reviewed and updated as appropriate: allergies, current medications, past family history, past medical history, past social history, past surgical history and problem list.   Objective:   Vitals:   04/05/19 0811  BP: 110/70  Pulse: 88  Weight: 243 lb (110.2 kg)    Fetal Status: Fetal Heart Rate (bpm): 143 Fundal Height: 27 cm Movement: Present     General:  Alert, oriented and cooperative. Patient is in no acute distress.  Skin: Skin is warm and dry. No rash noted.   Cardiovascular: Normal heart rate noted  Respiratory: Normal respiratory effort, no problems with respiration noted  Abdomen: Soft, gravid, appropriate for gestational age.  Pain/Pressure: Absent     Pelvic: Cervical exam deferred        Extremities: Normal range of motion.  Edema: None  Mental Status: Normal mood and affect. Normal behavior. Normal judgment and thought content.   Assessment and Plan:  Pregnancy: G2P0010 at [redacted]w[redacted]d 1. Supervision of other normal pregnancy, antepartum - Pt doing well. No complaints today - Routine prenatal care with GTT and 28 week labs today  - 2 Hour GTT - HIV antibody (with reflex) - CBC - RPR  2. Trichomonal vaginitis during pregnancy in second trimester - Patient suppose to have TOC for trichnomonas - Patient reports that she took all of Flagyl that was prescribed, however she  reports that her partner was treated with Amoxicillin instead of Flagyl. She and partner have since had intercourse - After lengthy discussion, patient is unsure if partner was treated correctly for trichomonas. - Will treat patient for trichomonas again - Expedited partner treatment prescription sent with patient - TOC in 3 weeks   Preterm labor symptoms and general obstetric precautions including but not limited to vaginal bleeding, contractions, leaking of fluid and fetal movement were reviewed in detail with the patient. Please refer to After Visit Summary for other counseling recommendations.   Return in about 3 weeks (around 04/26/2019).  Future Appointments  Date Time Provider [place]  05/23/2019  1:15 PM [place] [id number]    Maryagnes Amos, SNM   I confirm that I have verified the information documented in the nurse midwife student's note and that I have also personally reperformed the history, physical exam and all medical decision making activities of this service and have verified that all service and findings are accurately documented in this student's note.    Elvera Maria, CNM 04/05/2019 11:46 AM

## 2019-04-06 LAB — CERVICOVAGINAL ANCILLARY ONLY
Comment: NEGATIVE
Trichomonas: POSITIVE — AB

## 2019-04-07 ENCOUNTER — Telehealth: Payer: Self-pay | Admitting: *Deleted

## 2019-04-07 NOTE — Telephone Encounter (Signed)
Left patient an urgent message about scheduled GTT on 04/11/2018 at the [facility] location.

## 2019-04-08 NOTE — L&D Delivery Note (Addendum)
Patient is 29 y.o. G2P0010 [redacted]w[redacted]d admitted IOL for postdates/LGA. S/p IOL with foley bulb, cytotec, followed by Pitocin. AROM at 1805.  Prenatal course also complicated by LGA, hx of HSV.  Delivery Note At 8:51 PM a viable female was delivered via Vaginal, Spontaneous (Presentation:   Occiput Anterior).  APGAR: 8, 9; weight  .   Placenta status: Spontaneous, Intact.  Cord: 3 vessels with the following complications: None.   Head delivered LOA. No nuchal cord present approximately 22 second shoulder dystocia before delivery of anterior shoulder and body with McRoberts manuever and suprapubic pressure. Infant with spontaneous cry, placed on mother's abdomen, dried and bulb suctioned. Cord clamped x 2 after 1-minute delay, and cut by nurse. Cord blood drawn. Placenta delivered spontaneously with gentle cord traction. Fundus firm with massage and Pitocin. Perineum inspected and found to have 1st degree laceration and b/l periurethral lacerations which was repaired with 3.0 and 4.0 vicryl with good hemostasis achieved.   Anesthesia: Epidural Episiotomy: None Lacerations: 1st degree; Bilateral Periurethral Suture Repair: 3.0 vicryl and 4.0 vicryl Est. Blood Loss (mL): 445  Mom to postpartum.  Baby to Couplet care / Skin to Skin.  Sandre Kitty 07/16/2019, 9:13 PM  OB FELLOW DELIVERY ATTESTATION  I was gloved and present for the delivery in its entirety, and I agree with the above resident's note.    Jerilynn Birkenhead, MD [facility] Family Medicine Fellow, [facility] for [employer], [facility]

## 2019-04-12 ENCOUNTER — Other Ambulatory Visit: Payer: Medicaid Other

## 2019-04-12 ENCOUNTER — Other Ambulatory Visit: Payer: Self-pay | Admitting: *Deleted

## 2019-04-12 ENCOUNTER — Other Ambulatory Visit: Payer: Self-pay

## 2019-04-12 DIAGNOSIS — Z3480 Encounter for supervision of other normal pregnancy, unspecified trimester: Secondary | ICD-10-CM

## 2019-04-13 LAB — GLUCOSE TOLERANCE, 2 HOURS W/ 1HR
Glucose, 1 hour: 133 mg/dL (ref 65–179)
Glucose, 2 hour: 121 mg/dL (ref 65–152)
Glucose, Fasting: 89 mg/dL (ref 65–91)

## 2019-04-13 LAB — CBC
Hematocrit: 32.8 % — ABNORMAL LOW (ref 34.0–46.6)
Hemoglobin: 10.8 g/dL — ABNORMAL LOW (ref 11.1–15.9)
MCH: 27.8 pg (ref 26.6–33.0)
MCHC: 32.9 g/dL (ref 31.5–35.7)
MCV: 85 fL (ref 79–97)
Platelets: 298 10*3/uL (ref 150–450)
RBC: 3.88 x10E6/uL (ref 3.77–5.28)
RDW: 12.8 % (ref 11.7–15.4)
WBC: 11.5 10*3/uL — ABNORMAL HIGH (ref 3.4–10.8)

## 2019-04-13 LAB — RPR: RPR Ser Ql: NONREACTIVE

## 2019-04-13 LAB — HIV ANTIBODY (ROUTINE TESTING W REFLEX): HIV Screen 4th Generation wRfx: NONREACTIVE

## 2019-04-26 ENCOUNTER — Other Ambulatory Visit (HOSPITAL_COMMUNITY)
Admission: RE | Admit: 2019-04-26 | Discharge: 2019-04-26 | Disposition: A | Discharge status: Home / Self Care | Payer: Medicaid Other | Source: Ambulatory Visit | Attending: Certified Nurse Midwife | Admitting: Certified Nurse Midwife

## 2019-04-26 ENCOUNTER — Ambulatory Visit (INDEPENDENT_AMBULATORY_CARE_PROVIDER_SITE_OTHER): Payer: Medicaid Other | Admitting: Certified Nurse Midwife

## 2019-04-26 VITALS — BP 113/70 | HR 88 | Wt 246.0 lb

## 2019-04-26 DIAGNOSIS — A5901 Trichomonal vulvovaginitis: Secondary | ICD-10-CM

## 2019-04-26 DIAGNOSIS — Z23 Encounter for immunization: Secondary | ICD-10-CM | POA: Diagnosis not present

## 2019-04-26 DIAGNOSIS — O23593 Infection of other part of genital tract in pregnancy, third trimester: Secondary | ICD-10-CM

## 2019-04-26 DIAGNOSIS — B75 Trichinellosis: Secondary | ICD-10-CM | POA: Insufficient documentation

## 2019-04-26 DIAGNOSIS — Z3480 Encounter for supervision of other normal pregnancy, unspecified trimester: Secondary | ICD-10-CM

## 2019-04-26 DIAGNOSIS — O2600 Excessive weight gain in pregnancy, unspecified trimester: Secondary | ICD-10-CM | POA: Insufficient documentation

## 2019-04-26 DIAGNOSIS — A599 Trichomoniasis, unspecified: Secondary | ICD-10-CM

## 2019-04-26 DIAGNOSIS — B009 Herpesviral infection, unspecified: Secondary | ICD-10-CM

## 2019-04-26 DIAGNOSIS — O2603 Excessive weight gain in pregnancy, third trimester: Secondary | ICD-10-CM

## 2019-04-26 DIAGNOSIS — Z3A29 29 weeks gestation of pregnancy: Secondary | ICD-10-CM

## 2019-04-26 DIAGNOSIS — O98513 Other viral diseases complicating pregnancy, third trimester: Secondary | ICD-10-CM

## 2019-04-26 NOTE — Patient Instructions (Signed)
AREA PEDIATRIC/FAMILY PRACTICE PHYSICIANS  [facility] [address] [place], [place] [address] Phone - [phone]   Fax - [phone]  [facility] [address] [place], [place]  [address] Phone - [phone]   Fax - [phone]  [facility] [address] [place], [place]  [address] Phone - [phone]   Fax - [phone]  [facility] [address] [place], [place]  [address] Phone - [phone]   Fax - [phone]  [facility] [address] [place], [place]  [address] Phone - [phone]   Fax - [phone]  [facility] [address] [place], [place]  [address] Phone - [phone]   Fax - [phone]  [facility] [address] [place], [place]  [address] Phone - [phone]   Fax - [phone]  [facility] [address] [place], [place]  [address] Phone - [phone]   Fax - [phone]  [facility] [address] [place], [place]  [address] Phone - [phone]   Fax - [phone] [facility] [address] [place], [place]  [address] Phone - [phone]   Fax - [phone]  [facility] [address] [place], [place]  [address] Phone - [phone]   Fax - [phone]  [facility] [address] [place], [place]  [address] Phone - [phone]   Fax - [phone]  [facility] [address] [place], [place]  [address] Phone - [phone]   Fax - [phone]  SHILPA GOSRANI [address] [place], [place]  [address] Phone - [phone]  [facility] [address] [place], [place]  [address] Phone - [phone]   Fax - [phone]  [facility] [address] [place], [place]  [address] Phone - [phone]   Fax - [phone]  [facility] [address] [place], [place]  [address] Phone - [phone]   Fax - [phone]  [facility] [address] [place], [place]  [address] Phone - [phone]   Fax - [phone]   [facility] [address] [place], [place]  [address] Phone - [phone]   Fax - [phone]  [facility] [address] [place], [place]  [address] Phone - [phone]   Fax - [phone]  DAV[facility] RUBIN [address] [place], [place]  [address] Phone - [phone]   Fax - [phone]  [facility] [address] [place], [place]  [address] Phone - [phone]   Fax - [phone]  [facility] [address] , [place]  [address] Phone - [phone]   Fax - [phone] [facility] [address] [place], [place]  [address] Phone - [phone]   Fax - [phone]  [facility] [address] [place], [place]  [address] Phone - [phone]   Fax - [phone]  [facility] [address] [place],   [address] Phone - [phone]   Fax - [phone]   

## 2019-04-26 NOTE — Progress Notes (Signed)
Subjective:  Sarah Villanueva is a 29 y.o. G2P0010 at [redacted]w[redacted]d being seen today for ongoing prenatal care.  She is currently monitored for the following issues for this low-risk pregnancy and has Supervision of normal pregnancy; Obesity in pregnancy; Not immune to rubella; Cystic fibrosis carrier; Herpes simplex infection during pregnancy; Trichomonal vaginitis in pregnancy; and Excess weight gain in pregnancy on their problem list.  Patient reports no complaints.  Contractions: Not present. Vag. Bleeding: None.  Movement: Present. Denies leaking of fluid.   The following portions of the patient's history were reviewed and updated as appropriate: allergies, current medications, past family history, past medical history, past social history, past surgical history and problem list. Problem list updated.  Objective:   Vitals:   04/26/19 1411  BP: 113/70  Pulse: 88  Weight: 246 lb (111.6 kg)    Fetal Status: Fetal Heart Rate (bpm): 132 Fundal Height: 31 cm Movement: Present     General:  Alert, oriented and cooperative. Patient is in no acute distress.  Skin: Skin is warm and dry. No rash noted.   Cardiovascular: Normal heart rate noted  Respiratory: Normal respiratory effort, no problems with respiration noted  Abdomen: Soft, gravid, appropriate for gestational age. Pain/Pressure: Absent     Pelvic: Vag. Bleeding: None Vag D/C Character: Thin   Cervical exam deferred        Extremities: Normal range of motion.  Edema: None  Mental Status: Normal mood and affect. Normal behavior. Normal judgment and thought content.   Urinalysis:      Assessment and Plan:  Pregnancy: G2P0010 at [redacted]w[redacted]d  1. Trichomonas infection - TOC - Cervicovaginal ancillary only( [place])  2. Supervision of other normal pregnancy, antepartum - Tdap vaccine greater than or equal to 7yo IM  3. Excessive weight gain during pregnancy in third trimester - Discussed reducing carbs, increase protein, eliminate all  refined sugars, and exercise 30 min 4-5 days a week  4. Herpes simplex infection in mother during third trimester of pregnancy - primary outbreak in September - start suppression at 35 weeks  Preterm labor symptoms and general obstetric precautions including but not limited to vaginal bleeding, contractions, leaking of fluid and fetal movement were reviewed in detail with the patient. Please refer to After Visit Summary for other counseling recommendations.  Return in about 3 weeks (around 05/17/2019).   Donette Larry, CNM

## 2019-04-28 DIAGNOSIS — A599 Trichomoniasis, unspecified: Secondary | ICD-10-CM

## 2019-04-28 LAB — CERVICOVAGINAL ANCILLARY ONLY
Comment: NEGATIVE
Trichomonas: POSITIVE — AB

## 2019-04-28 MED ORDER — METRONIDAZOLE 500 MG PO TABS: 500.0000 mg | ORAL_TABLET | Freq: Two times a day (BID) | ORAL | 0 refills | Status: DC

## 2019-04-28 NOTE — Telephone Encounter (Addendum)
Pt was sent a MyChart message letting her know of positive trich results and that medication has been sent to her pharmacy ----- Message from Donette Larry, CNM sent at 04/28/2019  4:34 PM EST ----- +trich again, Needs Flagyl 500 mg po bid x7 days and partner needs treatment. Abstain for 2-3 weeks after both are treated.

## 2019-05-17 ENCOUNTER — Telehealth: Payer: Medicaid Other | Admitting: Advanced Practice Midwife

## 2019-05-17 NOTE — Progress Notes (Signed)
Pt rescheduled appt.

## 2019-05-23 ENCOUNTER — Other Ambulatory Visit (HOSPITAL_COMMUNITY): Payer: Self-pay | Admitting: *Deleted

## 2019-05-23 ENCOUNTER — Other Ambulatory Visit: Payer: Self-pay

## 2019-05-23 ENCOUNTER — Ambulatory Visit (HOSPITAL_COMMUNITY)
Admission: RE | Admit: 2019-05-23 | Discharge: 2019-05-23 | Disposition: A | Discharge status: Home / Self Care | Payer: Medicaid Other | Source: Ambulatory Visit | Attending: Obstetrics and Gynecology | Admitting: Obstetrics and Gynecology

## 2019-05-23 DIAGNOSIS — O99213 Obesity complicating pregnancy, third trimester: Secondary | ICD-10-CM | POA: Diagnosis not present

## 2019-05-23 DIAGNOSIS — Z3689 Encounter for other specified antenatal screening: Secondary | ICD-10-CM | POA: Insufficient documentation

## 2019-05-23 DIAGNOSIS — O09893 Supervision of other high risk pregnancies, third trimester: Secondary | ICD-10-CM | POA: Diagnosis not present

## 2019-05-23 DIAGNOSIS — Z3A32 32 weeks gestation of pregnancy: Secondary | ICD-10-CM | POA: Diagnosis not present

## 2019-05-23 DIAGNOSIS — Z362 Encounter for other antenatal screening follow-up: Secondary | ICD-10-CM

## 2019-05-23 DIAGNOSIS — O3663X0 Maternal care for excessive fetal growth, third trimester, not applicable or unspecified: Secondary | ICD-10-CM

## 2019-05-30 ENCOUNTER — Telehealth: Payer: Self-pay | Admitting: *Deleted

## 2019-05-30 NOTE — Telephone Encounter (Signed)
Left patient an urgent message to call and schedule appointment.

## 2019-05-31 ENCOUNTER — Ambulatory Visit (INDEPENDENT_AMBULATORY_CARE_PROVIDER_SITE_OTHER): Payer: Medicaid Other | Admitting: Advanced Practice Midwife

## 2019-05-31 ENCOUNTER — Other Ambulatory Visit: Payer: Self-pay

## 2019-05-31 ENCOUNTER — Other Ambulatory Visit (HOSPITAL_COMMUNITY)
Admission: RE | Admit: 2019-05-31 | Discharge: 2019-05-31 | Disposition: A | Discharge status: Home / Self Care | Payer: Medicaid Other | Source: Ambulatory Visit | Attending: Advanced Practice Midwife | Admitting: Advanced Practice Midwife

## 2019-05-31 VITALS — BP 107/69 | HR 88 | Wt 251.0 lb

## 2019-05-31 DIAGNOSIS — Z8619 Personal history of other infectious and parasitic diseases: Secondary | ICD-10-CM | POA: Insufficient documentation

## 2019-05-31 DIAGNOSIS — O3663X0 Maternal care for excessive fetal growth, third trimester, not applicable or unspecified: Secondary | ICD-10-CM | POA: Insufficient documentation

## 2019-05-31 DIAGNOSIS — Z3480 Encounter for supervision of other normal pregnancy, unspecified trimester: Secondary | ICD-10-CM

## 2019-05-31 DIAGNOSIS — Z3A34 34 weeks gestation of pregnancy: Secondary | ICD-10-CM

## 2019-05-31 DIAGNOSIS — O163 Unspecified maternal hypertension, third trimester: Secondary | ICD-10-CM

## 2019-05-31 DIAGNOSIS — O335XX0 Maternal care for disproportion due to unusually large fetus, not applicable or unspecified: Secondary | ICD-10-CM

## 2019-05-31 DIAGNOSIS — O98313 Other infections with a predominantly sexual mode of transmission complicating pregnancy, third trimester: Secondary | ICD-10-CM

## 2019-05-31 DIAGNOSIS — A6009 Herpesviral infection of other urogenital tract: Secondary | ICD-10-CM

## 2019-05-31 DIAGNOSIS — A6000 Herpesviral infection of urogenital system, unspecified: Secondary | ICD-10-CM | POA: Insufficient documentation

## 2019-05-31 MED ORDER — VALACYCLOVIR HCL 500 MG PO TABS: 500.0000 mg | ORAL_TABLET | Freq: Two times a day (BID) | ORAL | 1 refills | Status: DC

## 2019-05-31 NOTE — Progress Notes (Signed)
   PRENATAL VISIT NOTE  Subjective:  Sarah Villanueva is a 29 y.o. G2P0010 at [redacted]w[redacted]d being seen today for ongoing prenatal care.  She is currently monitored for the following issues for this low-risk pregnancy and has Supervision of normal pregnancy; Obesity in pregnancy; Not immune to rubella; Cystic fibrosis carrier; Herpes simplex infection during pregnancy; Trichomonal vaginitis in pregnancy; Excess weight gain in pregnancy; Recurrent genital HSV (herpes simplex virus) infection; Hypertension during pregnancy in third trimester; and Fetal disproportion on their problem list.  Patient reports no complaints.  Contractions: Not present. Vag. Bleeding: None.  Movement: Present. Denies leaking of fluid.   The following portions of the patient's history were reviewed and updated as appropriate: allergies, current medications, past family history, past medical history, past social history, past surgical history and problem list.   Objective:   Vitals:   05/31/19 1026  BP: 107/69  Pulse: 88  Weight: 251 lb (113.9 kg)    Fetal Status: Fetal Heart Rate (bpm): 132 Fundal Height: 35 cm Movement: Present     General:  Alert, oriented and cooperative. Patient is in no acute distress.  Skin: Skin is warm and dry. No rash noted.   Cardiovascular: Normal heart rate noted  Respiratory: Normal respiratory effort, no problems with respiration noted  Abdomen: Soft, gravid, appropriate for gestational age.  Pain/Pressure: Absent     Pelvic: Cervical exam deferred        Extremities: Normal range of motion.  Edema: None  Mental Status: Normal mood and affect. Normal behavior. Normal judgment and thought content.   Assessment and Plan:  Pregnancy: G2P0010 at [redacted]w[redacted]d 1. History of trichomonal vaginitis --Retreated, partner treated and no sexual activity since treatment --TOC today - Cervicovaginal ancillary only( [place])  2. Large fetus causing disproportion, single or unspecified fetus --[place] EFW  99%tile, Today's fundal height is appropriate. Pt is 5'11" tall.   --Discussed risks of LGA, and need to watch labor progress. Pt states understanding. --F/U US was scheduled by MFM for growth  3. Supervision of other normal pregnancy, antepartum --Anticipatory guidance about next visits/weeks of pregnancy given. --Next visit in 2 weeks in the office  4. Hypertension during pregnancy in third trimester, unspecified hypertension in pregnancy type --Pt reported BPs elevated at home, numbers were all 130s/80s, none 140/90 or higher.  Cuff here reads 110s/70s.  No s/sx of PEC, no h/a today.  --PEC precautions reviewed. Pt to take BP at home daily at same time, or every other day until appt in 2 weeks.  As long as cuff is consistent, and no elevated readings above 140/90, she should just bring cuff to next appt for [place] to check. --If s/sx of PEC or elevated readings, call office if open, or go to [facility] for further evaluation.  5. Genital herpes affecting pregnancy in third trimester --Start suppression at 35 weeks, Rx for Valtrex 500 mg BID.  Preterm labor symptoms and general obstetric precautions including but not limited to vaginal bleeding, contractions, leaking of fluid and fetal movement were reviewed in detail with the patient. Please refer to After Visit Summary for other counseling recommendations.   Return in about 2 weeks (around 06/14/2019).  Future Appointments  Date Time Provider Department Center  06/13/2019  1:45 PM Anyanwu, Jethro Bastos, MD [facility]  06/20/2019  2:45 PM [facility] NURSE [facility]  06/20/2019  2:45 PM [facility] [place] 2 [facility]    Sharen Counter, CNM

## 2019-05-31 NOTE — Patient Instructions (Signed)
Third Trimester of Pregnancy The third trimester is from week 28 through week 40 (months 7 through 9). The third trimester is a time when the unborn baby (fetus) is growing rapidly. At the end of the ninth month, the fetus is about 20 inches in length and weighs 6-10 pounds. Body changes during your third trimester Your body will continue to go through many changes during pregnancy. The changes vary from woman to woman. During the third trimester:  Your weight will continue to increase. You can expect to gain 25-35 pounds (11-16 kg) by the end of the pregnancy.  You may begin to get stretch marks on your hips, abdomen, and breasts.  You may urinate more often because the fetus is moving lower into your pelvis and pressing on your bladder.  You may develop or continue to have heartburn. This is caused by increased hormones that slow down muscles in the digestive tract.  You may develop or continue to have constipation because increased hormones slow digestion and cause the muscles that push waste through your intestines to relax.  You may develop hemorrhoids. These are swollen veins (varicose veins) in the rectum that can itch or be painful.  You may develop swollen, bulging veins (varicose veins) in your legs.  You may have increased body aches in the pelvis, back, or thighs. This is due to weight gain and increased hormones that are relaxing your joints.  You may have changes in your hair. These can include thickening of your hair, rapid growth, and changes in texture. Some women also have hair loss during or after pregnancy, or hair that feels dry or thin. Your hair will most likely return to normal after your baby is born.  Your breasts will continue to grow and they will continue to become tender. A yellow fluid (colostrum) may leak from your breasts. This is the first milk you are producing for your baby.  Your belly button may stick out.  You may notice more swelling in your hands,  face, or ankles.  You may have increased tingling or numbness in your hands, arms, and legs. The skin on your belly may also feel numb.  You may feel short of breath because of your expanding uterus.  You may have more problems sleeping. This can be caused by the size of your belly, increased need to urinate, and an increase in your body's metabolism.  You may notice the fetus "dropping," or moving lower in your abdomen (lightening).  You may have increased vaginal discharge.  You may notice your joints feel loose and you may have pain around your pelvic bone. What to expect at prenatal visits You will have prenatal exams every 2 weeks until week 36. Then you will have weekly prenatal exams. During a routine prenatal visit:  You will be weighed to make sure you and the baby are growing normally.  Your blood pressure will be taken.  Your abdomen will be measured to track your baby's growth.  The fetal heartbeat will be listened to.  Any test results from the previous visit will be discussed.  You may have a cervical check near your due date to see if your cervix has softened or thinned (effaced).  You will be tested for Group B streptococcus. This happens between 35 and 37 weeks. Your health care provider may ask you:  What your birth plan is.  How you are feeling.  If you are feeling the baby move.  If you have had any abnormal   symptoms, such as leaking fluid, bleeding, severe headaches, or abdominal cramping.  If you are using any tobacco products, including cigarettes, chewing tobacco, and electronic cigarettes.  If you have any questions. Other tests or screenings that may be performed during your third trimester include:  Blood tests that check for low iron levels (anemia).  Fetal testing to check the health, activity level, and growth of the fetus. Testing is done if you have certain medical conditions or if there are problems during the pregnancy.  Nonstress test  (NST). This test checks the health of your baby to make sure there are no signs of problems, such as the baby not getting enough oxygen. During this test, a belt is placed around your belly. The baby is made to move, and its heart rate is monitored during movement. What is false labor? False labor is a condition in which you feel small, irregular tightenings of the muscles in the womb (contractions) that usually go away with rest, changing position, or drinking water. These are called Braxton Hicks contractions. Contractions may last for hours, days, or even weeks before true labor sets in. If contractions come at regular intervals, become more frequent, increase in intensity, or become painful, you should see your health care provider. What are the signs of labor?  Abdominal cramps.  Regular contractions that start at 10 minutes apart and become stronger and more frequent with time.  Contractions that start on the top of the uterus and spread down to the lower abdomen and back.  Increased pelvic pressure and dull back pain.  A watery or bloody mucus discharge that comes from the vagina.  Leaking of amniotic fluid. This is also known as your "water breaking." It could be a slow trickle or a gush. Let your health care provider know if it has a color or strange odor. If you have any of these signs, call your health care provider right away, even if it is before your due date. Follow these instructions at home: Medicines  Follow your health care provider's instructions regarding medicine use. Specific medicines may be either safe or unsafe to take during pregnancy.  Take a prenatal vitamin that contains at least 600 micrograms (mcg) of folic acid.  If you develop constipation, try taking a stool softener if your health care provider approves. Eating and drinking   Eat a balanced diet that includes fresh fruits and vegetables, whole grains, good sources of protein such as meat, eggs, or tofu,  and low-fat dairy. Your health care provider will help you determine the amount of weight gain that is right for you.  Avoid raw meat and uncooked cheese. These carry germs that can cause birth defects in the baby.  If you have low calcium intake from food, talk to your health care provider about whether you should take a daily calcium supplement.  Eat four or five small meals rather than three large meals a day.  Limit foods that are high in fat and processed sugars, such as fried and sweet foods.  To prevent constipation: ? Drink enough fluid to keep your urine clear or pale yellow. ? Eat foods that are high in fiber, such as fresh fruits and vegetables, whole grains, and beans. Activity  Exercise only as directed by your health care provider. Most women can continue their usual exercise routine during pregnancy. Try to exercise for 30 minutes at least 5 days a week. Stop exercising if you experience uterine contractions.  Avoid heavy lifting.  Do   not exercise in extreme heat or humidity, or at high altitudes.  Wear low-heel, comfortable shoes.  Practice good posture.  You may continue to have sex unless your health care provider tells you otherwise. Relieving pain and discomfort  Take frequent breaks and rest with your legs elevated if you have leg cramps or low back pain.  Take warm sitz baths to soothe any pain or discomfort caused by hemorrhoids. Use hemorrhoid cream if your health care provider approves.  Wear a good support bra to prevent discomfort from breast tenderness.  If you develop varicose veins: ? Wear support pantyhose or compression stockings as told by your healthcare provider. ? Elevate your feet for 15 minutes, 3-4 times a day. Prenatal care  Write down your questions. Take them to your prenatal visits.  Keep all your prenatal visits as told by your health care provider. This is important. Safety  Wear your seat belt at all times when driving.  Make  a list of emergency phone numbers, including numbers for family, friends, the hospital, and police and fire departments. General instructions  Avoid cat litter boxes and soil used by cats. These carry germs that can cause birth defects in the baby. If you have a cat, ask someone to clean the litter box for you.  Do not travel far distances unless it is absolutely necessary and only with the approval of your health care provider.  Do not use hot tubs, steam rooms, or saunas.  Do not drink alcohol.  Do not use any products that contain nicotine or tobacco, such as cigarettes and e-cigarettes. If you need help quitting, ask your health care provider.  Do not use any medicinal herbs or unprescribed drugs. These chemicals affect the formation and growth of the baby.  Do not douche or use tampons or scented sanitary pads.  Do not cross your legs for long periods of time.  To prepare for the arrival of your baby: ? Take prenatal classes to understand, practice, and ask questions about labor and delivery. ? Make a trial run to the hospital. ? Visit the hospital and tour the maternity area. ? Arrange for maternity or paternity leave through employers. ? Arrange for family and friends to take care of pets while you are in the hospital. ? Purchase a rear-facing car seat and make sure you know how to install it in your car. ? Pack your hospital bag. ? Prepare the baby's nursery. Make sure to remove all pillows and stuffed animals from the baby's crib to prevent suffocation.  Visit your dentist if you have not gone during your pregnancy. Use a soft toothbrush to brush your teeth and be gentle when you floss. Contact a health care provider if:  You are unsure if you are in labor or if your water has broken.  You become dizzy.  You have mild pelvic cramps, pelvic pressure, or nagging pain in your abdominal area.  You have lower back pain.  You have persistent nausea, vomiting, or  diarrhea.  You have an unusual or bad smelling vaginal discharge.  You have pain when you urinate. Get help right away if:  Your water breaks before 37 weeks.  You have regular contractions less than 5 minutes apart before 37 weeks.  You have a fever.  You are leaking fluid from your vagina.  You have spotting or bleeding from your vagina.  You have severe abdominal pain or cramping.  You have rapid weight loss or weight gain.  You have   shortness of breath with chest pain.  You notice sudden or extreme swelling of your face, hands, ankles, feet, or legs.  Your baby makes fewer than 10 movements in 2 hours.  You have severe headaches that do not go away when you take medicine.  You have vision changes. Summary  The third trimester is from week 28 through week 40, months 7 through 9. The third trimester is a time when the unborn baby (fetus) is growing rapidly.  During the third trimester, your discomfort may increase as you and your baby continue to gain weight. You may have abdominal, leg, and back pain, sleeping problems, and an increased need to urinate.  During the third trimester your breasts will keep growing and they will continue to become tender. A yellow fluid (colostrum) may leak from your breasts. This is the first milk you are producing for your baby.  False labor is a condition in which you feel small, irregular tightenings of the muscles in the womb (contractions) that eventually go away. These are called Braxton Hicks contractions. Contractions may last for hours, days, or even weeks before true labor sets in.  Signs of labor can include: abdominal cramps; regular contractions that start at 10 minutes apart and become stronger and more frequent with time; watery or bloody mucus discharge that comes from the vagina; increased pelvic pressure and dull back pain; and leaking of amniotic fluid. This information is not intended to replace advice given to you by your  health care provider. Make sure you discuss any questions you have with your health care provider. Document Revised: 07/15/2018 Document Reviewed: 04/29/2016 Elsevier Patient Education  2020 [employer]  

## 2019-06-01 LAB — CERVICOVAGINAL ANCILLARY ONLY
Chlamydia: NEGATIVE
Comment: NEGATIVE
Comment: NEGATIVE
Comment: NORMAL
Neisseria Gonorrhea: NEGATIVE
Trichomonas: POSITIVE — AB

## 2019-06-02 ENCOUNTER — Telehealth: Payer: Self-pay | Admitting: *Deleted

## 2019-06-02 MED ORDER — METRONIDAZOLE 500 MG PO TABS: 500.0000 mg | ORAL_TABLET | Freq: Two times a day (BID) | ORAL | 1 refills | Status: DC

## 2019-06-02 NOTE — Telephone Encounter (Signed)
-----   Message from Hurshel Party, CNM sent at 06/01/2019  5:08 PM EST ----- Pt TOC for trichomonas is still positive. She reported to me that she and partner were treated and that they had not had intercourse since the last treatment.  I want to give her a choice of treatment.  We can treat her with the longer course of Flagyl, 500 mg BID x 7 days and give her a refill she can share with her partner.  Or, she can take the alternative drug, which is Tinidazole.  It is expensive and not well covered by insurance.  You can offer it now, [facility] has it for $14.82 at [employer], $28 and $29 at [facility].  Her partner will also need the same treatment.  Write her Rx for Tinidazole 2 g x 1 dose with 1 refill.  She should always take these medicines with food. Please call to let her know.  We should schedule her for a TOC in 3 weeks (or at her next visit).

## 2019-06-02 NOTE — Telephone Encounter (Signed)
Pt notified of still positive TOC for Trich.  She is offered another round of Flagyl with RF's to share with her partner.  She is advised not to have intercourse until both parties have finished their course of antibiotics.  Will do TOC at next visit.

## 2019-06-13 ENCOUNTER — Inpatient Hospital Stay (HOSPITAL_COMMUNITY)
Admission: AD | Admit: 2019-06-13 | Discharge: 2019-06-13 | Disposition: A | Discharge status: Home / Self Care | Payer: Medicaid Other | Attending: Family Medicine | Admitting: Family Medicine

## 2019-06-13 ENCOUNTER — Other Ambulatory Visit: Payer: Self-pay

## 2019-06-13 ENCOUNTER — Other Ambulatory Visit (HOSPITAL_COMMUNITY)
Admission: RE | Admit: 2019-06-13 | Discharge: 2019-06-13 | Disposition: A | Discharge status: Home / Self Care | Payer: Medicaid Other | Source: Ambulatory Visit | Attending: Obstetrics & Gynecology | Admitting: Obstetrics & Gynecology

## 2019-06-13 ENCOUNTER — Encounter (HOSPITAL_COMMUNITY): Payer: Self-pay | Admitting: Family Medicine

## 2019-06-13 ENCOUNTER — Ambulatory Visit (INDEPENDENT_AMBULATORY_CARE_PROVIDER_SITE_OTHER): Payer: Medicaid Other | Admitting: Obstetrics & Gynecology

## 2019-06-13 VITALS — BP 110/71 | HR 90 | Temp 99.0°F | Wt 257.0 lb

## 2019-06-13 DIAGNOSIS — Z3689 Encounter for other specified antenatal screening: Secondary | ICD-10-CM

## 2019-06-13 DIAGNOSIS — O23593 Infection of other part of genital tract in pregnancy, third trimester: Secondary | ICD-10-CM | POA: Insufficient documentation

## 2019-06-13 DIAGNOSIS — O98513 Other viral diseases complicating pregnancy, third trimester: Secondary | ICD-10-CM

## 2019-06-13 DIAGNOSIS — O99210 Obesity complicating pregnancy, unspecified trimester: Secondary | ICD-10-CM

## 2019-06-13 DIAGNOSIS — A5901 Trichomonal vulvovaginitis: Secondary | ICD-10-CM

## 2019-06-13 DIAGNOSIS — Z3A35 35 weeks gestation of pregnancy: Secondary | ICD-10-CM | POA: Insufficient documentation

## 2019-06-13 DIAGNOSIS — O4693 Antepartum hemorrhage, unspecified, third trimester: Secondary | ICD-10-CM | POA: Insufficient documentation

## 2019-06-13 DIAGNOSIS — Z3480 Encounter for supervision of other normal pregnancy, unspecified trimester: Secondary | ICD-10-CM

## 2019-06-13 DIAGNOSIS — O3663X0 Maternal care for excessive fetal growth, third trimester, not applicable or unspecified: Secondary | ICD-10-CM

## 2019-06-13 DIAGNOSIS — A6000 Herpesviral infection of urogenital system, unspecified: Secondary | ICD-10-CM

## 2019-06-13 DIAGNOSIS — O2603 Excessive weight gain in pregnancy, third trimester: Secondary | ICD-10-CM

## 2019-06-13 DIAGNOSIS — Z789 Other specified health status: Secondary | ICD-10-CM

## 2019-06-13 DIAGNOSIS — B009 Herpesviral infection, unspecified: Secondary | ICD-10-CM

## 2019-06-13 LAB — POCT FERN TEST: POCT Fern Test: NEGATIVE

## 2019-06-13 LAB — OB RESULTS CONSOLE GBS: GBS: NEGATIVE

## 2019-06-13 NOTE — [facility]U Note (Signed)
Patient states she went into the office today for a routine checkup and to have some swabs done. She stated she had her cervix examined and felt some pressure and discomfort and then felt a gush of blood come out. States she is not having any pain and that the blood she saw when she wiped was brown. Reports good fetal movement.

## 2019-06-13 NOTE — Progress Notes (Signed)
   PRENATAL VISIT NOTE  Subjective:  Sarah Villanueva is a 29 y.o. G2P0010 at [redacted]w[redacted]d being seen today for ongoing prenatal care.  She is currently monitored for the following issues for this low-risk pregnancy and has Supervision of normal pregnancy; Obesity in pregnancy; Not immune to rubella; Cystic fibrosis carrier; Herpes simplex infection during pregnancy; Trichomonal vaginitis in pregnancy; Excess weight gain in pregnancy; Recurrent genital HSV (herpes simplex virus) infection; and Large for gestational age fetus in third trimester on their problem list.  Patient reports no complaints.  Contractions: Not present. Vag. Bleeding: None.  Movement: Present. Denies leaking of fluid.   The following portions of the patient's history were reviewed and updated as appropriate: allergies, current medications, past family history, past medical history, past social history, past surgical history and problem list.   Objective:   Vitals:   06/13/19 1337  BP: 110/71  Pulse: 90  Temp: 99 F (37.2 C)  Weight: 257 lb (116.6 kg)    Fetal Status: Fetal Heart Rate (bpm): 131 Fundal Height: 38 cm Movement: Present     General:  Alert, oriented and cooperative. Patient is in no acute distress.  Skin: Skin is warm and dry. No rash noted.   Cardiovascular: Normal heart rate noted  Respiratory: Normal respiratory effort, no problems with respiration noted  Abdomen: Soft, gravid, appropriate for gestational age.  Pain/Pressure: Absent     Pelvic: Cervical exam performed Dilation: Fingertip Effacement (%): Thick Station: Ballotable There was immediate bright red bleeding after cervical exam, about 50 ml of BRB. Stopped after a few seconds. Did not do speculum exam.   Extremities: Normal range of motion.  Edema: None  Mental Status: Normal mood and affect. Normal behavior. Normal judgment and thought content.   Assessment and Plan:  Pregnancy: G2P0010 at [redacted]w[redacted]d 1. Third trimester bleeding Unsure etiology.  No history of previa or other anomaly. Recent Trichomonal infection.  ?AROM during exam. Will send to [facility] MAU for further evaluation. [facility] staff notified.   2. Trichomonal vaginitis during pregnancy in third trimester Just completed second treatment regimen, TOC in 2-3 weeks  3. Recurrent genital HSV (herpes simplex virus) infection Already on Valtrex suppression  4. Large for gestational age fetus in third trimester Follow up scan scheduled 06/20/19; EFW 99%  5. Supervision of other normal pregnancy, antepartum Pelvic cultures done tody.  - Culture, beta strep (group b only) - Cervicovaginal ancillary only( ) Preterm labor symptoms and general obstetric precautions including but not limited to vaginal bleeding, contractions, leaking of fluid and fetal movement were reviewed in detail with the patient. Please refer to After Visit Summary for other counseling recommendations.   Return in about 1 week (around 06/20/2019) for OFFICE OB Visit.  Future Appointments  Date Time Provider Department Center  06/20/2019  2:45 PM [facility] NURSE [facility]  06/20/2019  2:45 PM [facility] [place] 2 [facility]    Jaynie Collins, MD

## 2019-06-13 NOTE — MAU Provider Note (Signed)
History   [id number]   Chief Complaint  Patient presents with  . Vaginal Bleeding    HPI Sarah Villanueva is a 29 y.o. female  G2P0010 '@35' .6 wks sent from office d/t bleeding after cervical exam. She denies LOF or watery discharge. Denies ctx. She reports good fetal movement. She was recently retreated for Trich. She has not had IC. All other systems negative.    Patient's last menstrual period was 10/05/2018.  OB History  Gravida Para Term Preterm AB Living  2       1    SAB TAB Ectopic Multiple Live Births    1          # Outcome Date GA Lbr Len/2nd Weight Sex Delivery Anes PTL Lv  2 Current           1 TAB 2016            History reviewed. No pertinent past medical history.  Family History  Problem Relation Age of Onset  . Hypertension Paternal Grandfather   . Diabetes Paternal Grandfather   . Hypertension Paternal Grandmother   . Diabetes Paternal Grandmother   . Diabetes Father   . Hypertension Father   . Cervical cancer Mother     Social History   Socioeconomic History  . Marital status: Single    Spouse name: Not on file  . Number of children: Not on file  . Years of education: Not on file  . Highest education level: Not on file  Occupational History  . Not on file  Tobacco Use  . Smoking status: Never Smoker  . Smokeless tobacco: Never Used  Substance and Sexual Activity  . Alcohol use: Not Currently    Comment: occ  . Drug use: Never  . Sexual activity: Yes    Birth control/protection: None  Other Topics Concern  . Not on file  Social History Narrative  . Not on file   Social Determinants of Health   Financial Resource Strain:   . Difficulty of Paying Living Expenses: Not on file  Food Insecurity:   . Worried About Charity fundraiser in the Last Year: Not on file  . Ran Out of Food in the Last Year: Not on file  Transportation Needs:   . Lack of Transportation (Medical): Not on file  . Lack of Transportation (Non-Medical): Not on  file  Physical Activity:   . Days of Exercise per Week: Not on file  . Minutes of Exercise per Session: Not on file  Stress:   . Feeling of Stress : Not on file  Social Connections:   . Frequency of Communication with Friends and Family: Not on file  . Frequency of Social Gatherings with Friends and Family: Not on file  . Attends Religious Services: Not on file  . Active Member of Clubs or Organizations: Not on file  . Attends Archivist Meetings: Not on file  . Marital Status: Not on file    No Known Allergies  No current facility-administered medications on file prior to encounter.   Current Outpatient Medications on File Prior to Encounter  Medication Sig Dispense Refill  . Blood Pressure Monitoring (BLOOD PRESSURE KIT) DEVI 1 Device by Does not apply route once a week. Pt to take BP weekly during pregnancy 1 Device 0  . Elastic Bandages & Supports (COMFORT FIT MATERNITY SUPP LG) MISC 1 Units by Does not apply route daily. 1 each 0  . Prenatal Vit-Fe Fumarate-FA (PRENATAL VITAMINS  PO) Take by mouth.    . valACYclovir (VALTREX) 500 MG tablet Take 1 tablet (500 mg total) by mouth 2 (two) times daily. 60 tablet 1     Review of Systems  Gastrointestinal: Negative for abdominal pain.  Genitourinary: Positive for vaginal bleeding. Negative for vaginal discharge.   Physical Exam   Vitals:   06/13/19 1526  BP: 116/71  Pulse: 77  Resp: 17  Temp: 98.5 F (36.9 C)  TempSrc: Oral  SpO2: 98%    Physical Exam  Nursing note and vitals reviewed. Constitutional: She is oriented to person, place, and time. She appears well-developed and well-nourished. No distress.  HENT:  Head: Normocephalic and atraumatic.  Cardiovascular: Normal rate.  Respiratory: Effort normal. No respiratory distress.  GI: Soft. She exhibits no distension. There is no abdominal tenderness.  gravid  Genitourinary:    Genitourinary Comments: SSE: no pool, visually FT, scant brown discharge, no  bleeding, fern neg    Musculoskeletal:        General: Normal range of motion.     Cervical back: Normal range of motion.  Neurological: She is alert and oriented to person, place, and time.  Skin: Skin is warm and dry.  Psychiatric: She has a normal mood and affect.  EFM: 125 bpm, mod variability, + accels, no decels Toco: irritability  Results for orders placed or performed during the hospital encounter of 06/13/19 (from the past 24 hour(s))  Fern Test     Status: None   Collection Time: 06/13/19  4:23 PM  Result Value Ref Range   POCT Fern Test Negative = intact amniotic membranes    MAU Course  Procedures  MDM Labs ordered and reviewed. No evidence of SROM and no further bleeding. NST reactive. Suspect bleeding was from cervix friable from recent infection. Stable for discharge home.  Assessment and Plan  [redacted] weeks gestation Reactive NST Vaginal bleeding in pregnancy Discharge home Follow up at [facility] as scheduled Bleeding precautions FMCs  Allergies as of 06/13/2019   No Known Allergies     Medication List    TAKE these medications   Blood Pressure Kit Devi 1 Device by Does not apply route once a week. Pt to take BP weekly during pregnancy   [place] 1 Units by Does not apply route daily.   PRENATAL VITAMINS PO Take by mouth.   valACYclovir 500 MG tablet Commonly known as: Valtrex Take 1 tablet (500 mg total) by mouth 2 (two) times daily.      Julianne Handler, CNM 06/13/2019 4:32 PM

## 2019-06-13 NOTE — Patient Instructions (Signed)
Return to office for any scheduled appointments. Call the office or go to the MAU at [facility] [place] if:  You begin to have strong, frequent contractions  Your water breaks.  Sometimes it is a big gush of fluid, sometimes it is just a trickle that keeps getting your panties wet or running down your legs  You have vaginal bleeding.  It is normal to have a small amount of spotting if your cervix was checked.   You do not feel your baby moving like normal.  If you do not, get something to eat and drink and lay down and focus on feeling your baby move.   If your baby is still not moving like normal, you should call the office or go to MAU.  Any other obstetric concerns.   

## 2019-06-13 NOTE — Discharge Instructions (Signed)
Vaginal Bleeding During Pregnancy, Third Trimester  A small amount of bleeding from the vagina (spotting) is relatively common during pregnancy. Various things can cause bleeding or spotting during pregnancy. Sometimes bleeding is normal and is not a problem. However, bleeding during the third trimester can also be a sign of something serious for the mother and the baby. Be sure to tell your health care provider about any vaginal bleeding right away. Some possible causes of vaginal bleeding during the third trimester include:  Infection or growths (polyps) on the cervix.  A condition in which the placenta partially or completely covers the opening of the cervix inside the uterus (placenta previa).  The placenta separating from the uterus (placenta abruption).  The start of labor (discharging of the mucus plug).  A condition in which the placenta grows into the muscle layer of the uterus (placenta accreta). Follow these instructions at home: Activity  Follow instructions from your health care provider about limiting your activity. If your health care provider recommends activity restriction, you may need to stay in bed and only get up to use the bathroom. In some cases, your health care provider may allow you to continue light activity.  If needed, make plans for someone to help with your regular activities.  Ask your health care provider if it is safe for you to drive.  Do not lift anything that is heavier than 10 lb (4.5 kg), or the limit that your health care provider tells you, until he or she says that it is safe.  Do not have sex or orgasms until your health care provider says that this is safe. Medicines  Take over-the-counter and prescription medicines only as told by your health care provider.  Do not take aspirin because it can cause bleeding. General instructions  Pay attention to any changes in your symptoms.  Write down how many pads you use each day, how often you  change pads, and how soaked (saturated) they are.  Do not use tampons or douche.  If you pass any tissue from your vagina, save the tissue so you can show it to your health care provider.  Keep all follow-up visits as told by your health care provider. This is important. Contact a health care provider if:  You have vaginal bleeding during any part of your pregnancy.  You have cramps or labor pains.  You have a fever. Get help right away if:  You have severe cramps or pain in your back or abdomen.  You have a gush of fluid from the vagina.  You pass large clots or a large amount of tissue from your vagina.  Your bleeding increases.  You feel light-headed or weak.  You faint.  You feel that your baby is moving less than usual, or not moving at all. Summary  Various things can cause bleeding or spotting in pregnancy.  Bleeding during the third trimester can be a sign of a serious problem for the mother and the baby.  Be sure to tell your health care provider about any vaginal bleeding right away. This information is not intended to replace advice given to you by your health care provider. Make sure you discuss any questions you have with your health care provider. Document Revised: 07/13/2018 Document Reviewed: 06/26/2016 Elsevier Patient Education  2020 Elsevier Inc.  Fetal Movement Counts Patient Name: ________________________________________________ Patient Due Date: ____________________ What is a fetal movement count?  A fetal movement count is the number of times that you feel your  baby move during a certain amount of time. This may also be called a fetal kick count. A fetal movement count is recommended for every pregnant woman. You may be asked to start counting fetal movements as early as week 28 of your pregnancy. Pay attention to when your baby is most active. You may notice your baby's sleep and wake cycles. You may also notice things that make your baby move  more. You should do a fetal movement count:  When your baby is normally most active.  At the same time each day. A good time to count movements is while you are resting, after having something to eat and drink. How do I count fetal movements? 1. Find a quiet, comfortable area. Sit, or lie down on your side. 2. Write down the date, the start time and stop time, and the number of movements that you felt between those two times. Take this information with you to your health care visits. 3. Write down your start time when you feel the first movement. 4. Count kicks, flutters, swishes, rolls, and jabs. You should feel at least 10 movements. 5. You may stop counting after you have felt 10 movements, or if you have been counting for 2 hours. Write down the stop time. 6. If you do not feel 10 movements in 2 hours, contact your health care provider for further instructions. Your health care provider may want to do additional tests to assess your baby's well-being. Contact a health care provider if:  You feel fewer than 10 movements in 2 hours.  Your baby is not moving like he or she usually does. Date: ____________ Start time: ____________ Stop time: ____________ Movements: ____________ Date: ____________ Start time: ____________ Stop time: ____________ Movements: ____________ Date: ____________ Start time: ____________ Stop time: ____________ Movements: ____________ Date: ____________ Start time: ____________ Stop time: ____________ Movements: ____________ Date: ____________ Start time: ____________ Stop time: ____________ Movements: ____________ Date: ____________ Start time: ____________ Stop time: ____________ Movements: ____________ Date: ____________ Start time: ____________ Stop time: ____________ Movements: ____________ Date: ____________ Start time: ____________ Stop time: ____________ Movements: ____________ Date: ____________ Start time: ____________ Stop time: ____________ Movements:  ____________ This information is not intended to replace advice given to you by your health care provider. Make sure you discuss any questions you have with your health care provider. Document Revised: 11/11/2018 Document Reviewed: 11/11/2018 Elsevier Patient Education  [place].

## 2019-06-15 LAB — CERVICOVAGINAL ANCILLARY ONLY
Chlamydia: NEGATIVE
Comment: NEGATIVE
Comment: NORMAL
Neisseria Gonorrhea: NEGATIVE

## 2019-06-16 LAB — CULTURE, BETA STREP (GROUP B ONLY)
MICRO NUMBER:: 10226343
SPECIMEN QUALITY:: ADEQUATE

## 2019-06-20 ENCOUNTER — Ambulatory Visit (HOSPITAL_COMMUNITY)
Admission: RE | Admit: 2019-06-20 | Discharge: 2019-06-20 | Disposition: A | Discharge status: Home / Self Care | Payer: Medicaid Other | Source: Ambulatory Visit | Attending: Obstetrics and Gynecology | Admitting: Obstetrics and Gynecology

## 2019-06-20 ENCOUNTER — Ambulatory Visit (INDEPENDENT_AMBULATORY_CARE_PROVIDER_SITE_OTHER): Payer: Medicaid Other | Admitting: Obstetrics & Gynecology

## 2019-06-20 ENCOUNTER — Encounter (HOSPITAL_COMMUNITY): Payer: Self-pay | Admitting: *Deleted

## 2019-06-20 ENCOUNTER — Other Ambulatory Visit: Payer: Self-pay

## 2019-06-20 ENCOUNTER — Other Ambulatory Visit (HOSPITAL_COMMUNITY)
Admission: RE | Admit: 2019-06-20 | Discharge: 2019-06-20 | Disposition: A | Discharge status: Home / Self Care | Payer: Medicaid Other | Source: Ambulatory Visit | Attending: Obstetrics & Gynecology | Admitting: Obstetrics & Gynecology

## 2019-06-20 ENCOUNTER — Ambulatory Visit (HOSPITAL_COMMUNITY): Payer: Medicaid Other | Admitting: *Deleted

## 2019-06-20 VITALS — BP 110/78 | HR 87 | Temp 98.3°F | Wt 257.0 lb

## 2019-06-20 DIAGNOSIS — A5901 Trichomonal vulvovaginitis: Secondary | ICD-10-CM | POA: Insufficient documentation

## 2019-06-20 DIAGNOSIS — O23593 Infection of other part of genital tract in pregnancy, third trimester: Secondary | ICD-10-CM | POA: Diagnosis present

## 2019-06-20 DIAGNOSIS — Z789 Other specified health status: Secondary | ICD-10-CM

## 2019-06-20 DIAGNOSIS — O09893 Supervision of other high risk pregnancies, third trimester: Secondary | ICD-10-CM

## 2019-06-20 DIAGNOSIS — O98313 Other infections with a predominantly sexual mode of transmission complicating pregnancy, third trimester: Secondary | ICD-10-CM

## 2019-06-20 DIAGNOSIS — O3663X0 Maternal care for excessive fetal growth, third trimester, not applicable or unspecified: Secondary | ICD-10-CM

## 2019-06-20 DIAGNOSIS — O99210 Obesity complicating pregnancy, unspecified trimester: Secondary | ICD-10-CM | POA: Diagnosis present

## 2019-06-20 DIAGNOSIS — O2603 Excessive weight gain in pregnancy, third trimester: Secondary | ICD-10-CM | POA: Insufficient documentation

## 2019-06-20 DIAGNOSIS — Z3A36 36 weeks gestation of pregnancy: Secondary | ICD-10-CM

## 2019-06-20 DIAGNOSIS — B009 Herpesviral infection, unspecified: Secondary | ICD-10-CM | POA: Diagnosis present

## 2019-06-20 DIAGNOSIS — A599 Trichomoniasis, unspecified: Secondary | ICD-10-CM

## 2019-06-20 DIAGNOSIS — Z3480 Encounter for supervision of other normal pregnancy, unspecified trimester: Secondary | ICD-10-CM

## 2019-06-20 DIAGNOSIS — O98513 Other viral diseases complicating pregnancy, third trimester: Secondary | ICD-10-CM

## 2019-06-20 DIAGNOSIS — O99213 Obesity complicating pregnancy, third trimester: Secondary | ICD-10-CM

## 2019-06-20 MED ORDER — BLOOD PRESSURE CUFF MISC: 1.0000 | 0 refills | Status: DC | PRN

## 2019-06-20 NOTE — Progress Notes (Signed)
   PRENATAL VISIT NOTE  Subjective:  Sarah Villanueva is a 29 y.o. G2P0010 at [redacted]w[redacted]d being seen today for ongoing prenatal care.  She is currently monitored for the following issues for this low-risk pregnancy and has Supervision of normal pregnancy; Obesity in pregnancy; Not immune to rubella; Cystic fibrosis carrier; Herpes simplex infection during pregnancy; Trichomonal vaginitis in pregnancy; Excess weight gain in pregnancy; Recurrent genital HSV (herpes simplex virus) infection; and Large for gestational age fetus in third trimester on their problem list.  Patient reports no complaints.  Contractions: Not present. Vag. Bleeding: None.  Movement: Present. Denies leaking of fluid.   The following portions of the patient's history were reviewed and updated as appropriate: allergies, current medications, past family history, past medical history, past social history, past surgical history and problem list.   Objective:   Vitals:   06/20/19 0803  BP: 110/78  Pulse: 87  Temp: 98.3 F (36.8 C)  Weight: 257 lb (116.6 kg)    Fetal Status: Fetal Heart Rate (bpm): 139   Movement: Present     General:  Alert, oriented and cooperative. Patient is in no acute distress.  Skin: Skin is warm and dry. No rash noted.   Cardiovascular: Normal heart rate noted  Respiratory: Normal respiratory effort, no problems with respiration noted  Abdomen: Soft, gravid, appropriate for gestational age.  Pain/Pressure: Absent     Pelvic: Cervical exam deferred      Will check next viist  Extremities: Normal range of motion.  Edema: None  Mental Status: Normal mood and affect. Normal behavior. Normal judgment and thought content.   Assessment and Plan:  Pregnancy: G2P0010 at [redacted]w[redacted]d 1. Trichomonas infection - TOC - Cervicovaginal ancillary only( [place])  2. Supervision of other normal pregnancy, antepartum - BP cuff not accurate.  Will order one for her to pick up in [facility] - Blood Pressure Monitoring  (BLOOD PRESSURE CUFF) MISC; 1 Device by Does not apply route as needed.  Dispense: 1 each; Refill: 0 - [place] for growth today.  Baby feels vertex and can confirm today at [place]. - GBS neg  Term labor symptoms and general obstetric precautions including but not limited to vaginal bleeding, contractions, leaking of fluid and fetal movement were reviewed in detail with the patient. Please refer to After Visit Summary for other counseling recommendations.   Return in about 1 week (around 06/27/2019).  Future Appointments  Date Time Provider Department Center  06/20/2019  2:45 PM [facility] NURSE [facility]  06/20/2019  2:45 PM [facility] [place] 2 [facility] MFC-US  06/28/2019 10:55 AM Leftwich-Kirby, Wilmer Floor, CNM [facility]    Elsie Lincoln, MD

## 2019-06-22 LAB — CERVICOVAGINAL ANCILLARY ONLY
Comment: NEGATIVE
Trichomonas: NEGATIVE

## 2019-06-28 ENCOUNTER — Ambulatory Visit (INDEPENDENT_AMBULATORY_CARE_PROVIDER_SITE_OTHER): Payer: Medicaid Other | Admitting: Advanced Practice Midwife

## 2019-06-28 ENCOUNTER — Other Ambulatory Visit: Payer: Self-pay

## 2019-06-28 VITALS — BP 113/77 | HR 91 | Temp 97.6°F | Wt 263.0 lb

## 2019-06-28 DIAGNOSIS — Z0371 Encounter for suspected problem with amniotic cavity and membrane ruled out: Secondary | ICD-10-CM

## 2019-06-28 DIAGNOSIS — O3663X0 Maternal care for excessive fetal growth, third trimester, not applicable or unspecified: Secondary | ICD-10-CM

## 2019-06-28 DIAGNOSIS — Z3480 Encounter for supervision of other normal pregnancy, unspecified trimester: Secondary | ICD-10-CM

## 2019-06-28 NOTE — Progress Notes (Signed)
Have a small burst of watery leakage this AM

## 2019-06-28 NOTE — Progress Notes (Signed)
   PRENATAL VISIT NOTE  Subjective:  Glennie Bose is a 29 y.o. G2P0010 at [redacted]w[redacted]d being seen today for ongoing prenatal care.  She is currently monitored for the following issues for this low-risk pregnancy and has Supervision of normal pregnancy; Obesity in pregnancy; Not immune to rubella; Cystic fibrosis carrier; Herpes simplex infection during pregnancy; Trichomonal vaginitis in pregnancy; Excess weight gain in pregnancy; Recurrent genital HSV (herpes simplex virus) infection; and Large for gestational age fetus in third trimester on their problem list.  Patient reports no complaints.  Contractions: Not present. Vag. Bleeding: None.  Movement: Present. Denies leaking of fluid.   The following portions of the patient's history were reviewed and updated as appropriate: allergies, current medications, past family history, past medical history, past social history, past surgical history and problem list.   Objective:   Vitals:   06/28/19 1048  BP: 113/77  Pulse: 91  Temp: 97.6 F (36.4 C)  Weight: 263 lb (119.3 kg)    Fetal Status: Fetal Heart Rate (bpm): 140 Fundal Height: 40 cm Movement: Present     General:  Alert, oriented and cooperative. Patient is in no acute distress.  Skin: Skin is warm and dry. No rash noted.   Cardiovascular: Normal heart rate noted  Respiratory: Normal respiratory effort, no problems with respiration noted  Abdomen: Soft, gravid, appropriate for gestational age.  Pain/Pressure: Absent     Pelvic: Cervical exam performed in the presence of a chaperone Dilation: 1 Effacement (%): 50 Station: -2  Extremities: Normal range of motion.  Edema: None  Mental Status: Normal mood and affect. Normal behavior. Normal judgment and thought content.   Assessment and Plan:  Pregnancy: G2P0010 at [redacted]w[redacted]d 1. Supervision of other normal pregnancy, antepartum --Anticipatory guidance about next visits/weeks of pregnancy given. --Discussed options for labor readiness,  cervical ripening including the [employer], evening primrose oil, and raspberry leaf tea. Programme researcher, broadcasting/film/video given. --Next visit next week in office at 39 weeks  2. Large for gestational age fetus in third trimester --EFW 4137 g, 9lb 2 oz at 36 weeks.  Pt is 5'11".  Fundal height 39.5 cm.  Discussed need to watch labor progress, may intervene if labor stalls or provides indication that baby is too large.   --Consider membrane sweep in office next week  3. Encounter for suspected premature rupture of amniotic membranes, with rupture of membranes not found --Pt with small gush of fluid, went through underwear with small spot on bed this morning. No leakage since.   --Negative pooling, ferning, and nitrazine in office.  No evidence of PROM. Precautions reviewed with pt if leakage returns.  Term labor symptoms and general obstetric precautions including but not limited to vaginal bleeding, contractions, leaking of fluid and fetal movement were reviewed in detail with the patient. Please refer to After Visit Summary for other counseling recommendations.   Return in about 1 week (around 07/05/2019).  Future Appointments  Date Time Provider Department Center  07/05/2019  4:00 PM Sharyon Cable, CNM [facility]    Sharen Counter, CNM

## 2019-06-28 NOTE — Patient Instructions (Signed)
Things to Try After 37 weeks to Encourage Labor/Get Ready for Labor:   1.  Try the [employer] Circuit at www.milescircuit.com daily to improve baby's position and encourage the onset of labor.  2. Walk a little and rest a little every day.  Change positions often.  3. Cervical Ripening: May try one or both a. Red Raspberry Leaf capsules or tea:  two 300mg or 400mg tablets with each meal, 2-3 times a day, or 1-3 cups of tea daily  Potential Side Effects Of Raspberry Leaf:  Most women do not experience any side effects from drinking raspberry leaf tea. However, nausea and loose stools are possible   b. Evening Primrose Oil capsules: may take 1 to 3 capsules daily. May also prick one to release the oil and insert it into your vagina at night.  Some of the potential side effects:  Upset stomach  Loose stools or diarrhea  Headaches  Nausea  4. Sex (and especially sex with orgasm) can also help the cervix ripen and encourage labor onset.   

## 2019-07-05 ENCOUNTER — Other Ambulatory Visit: Payer: Self-pay

## 2019-07-05 ENCOUNTER — Ambulatory Visit (INDEPENDENT_AMBULATORY_CARE_PROVIDER_SITE_OTHER): Payer: Medicaid Other | Admitting: Certified Nurse Midwife

## 2019-07-05 ENCOUNTER — Encounter: Payer: Self-pay | Admitting: Certified Nurse Midwife

## 2019-07-05 VITALS — BP 122/77 | HR 80 | Temp 99.3°F | Wt 263.0 lb

## 2019-07-05 DIAGNOSIS — O98519 Other viral diseases complicating pregnancy, unspecified trimester: Secondary | ICD-10-CM

## 2019-07-05 DIAGNOSIS — O3663X0 Maternal care for excessive fetal growth, third trimester, not applicable or unspecified: Secondary | ICD-10-CM

## 2019-07-05 DIAGNOSIS — B009 Herpesviral infection, unspecified: Secondary | ICD-10-CM

## 2019-07-05 DIAGNOSIS — Z3480 Encounter for supervision of other normal pregnancy, unspecified trimester: Secondary | ICD-10-CM

## 2019-07-05 NOTE — Patient Instructions (Signed)
Reasons to go to MAU:  1.  Contractions are  5 minutes apart or less, each last 1 minute, these have been going on for 1-2 hours, and you cannot walk or talk during them 2.  You have a large gush of fluid, or a trickle of fluid that will not stop and you have to wear a pad 3.  You have bleeding that is bright red, heavier than spotting--like menstrual bleeding (spotting can be normal in early labor or after a check of your cervix) 4.  You do not feel the baby moving like he/she normally does   Cervical Ripening: May try one or both  Red Raspberry Leaf capsules:  two 300mg  or 400mg  tablets with each meal, 2-3 times a day  Potential Side Effects Of Raspberry Leaf:  Most women do not experience any side effects from drinking raspberry leaf tea. However, nausea and loose stools are possible   Evening Primrose Oil capsules: may take 1 to 3 capsules daily. May also prick one to release the oil and insert it into your vagina at night.  Some of the potential side effects:  Upset stomach  Loose stools or diarrhea  Headaches  Nausea:

## 2019-07-05 NOTE — Progress Notes (Signed)
   PRENATAL VISIT NOTE  Subjective:  Sarah Villanueva is a 29 y.o. G2P0010 at [redacted]w[redacted]d being seen today for ongoing prenatal care.  She is currently monitored for the following issues for this low-risk pregnancy and has Supervision of normal pregnancy; Obesity in pregnancy; Not immune to rubella; Cystic fibrosis carrier; Herpes simplex infection during pregnancy; Trichomonal vaginitis in pregnancy; Excess weight gain in pregnancy; Recurrent genital HSV (herpes simplex virus) infection; and Large for gestational age fetus in third trimester on their problem list.  Patient reports no complaints.  Contractions: Not present. Vag. Bleeding: None.  Movement: Present. Denies leaking of fluid.   The following portions of the patient's history were reviewed and updated as appropriate: allergies, current medications, past family history, past medical history, past social history, past surgical history and problem list.   Objective:   Vitals:   07/05/19 1548  BP: 122/77  Pulse: 80  Temp: 99.3 F (37.4 C)  Weight: 263 lb (119.3 kg)    Fetal Status: Fetal Heart Rate (bpm): 143 Fundal Height: 41 cm Movement: Present     General:  Alert, oriented and cooperative. Patient is in no acute distress.  Skin: Skin is warm and dry. No rash noted.   Cardiovascular: Normal heart rate noted  Respiratory: Normal respiratory effort, no problems with respiration noted  Abdomen: Soft, gravid, appropriate for gestational age.  Pain/Pressure: Absent     Pelvic: Cervical exam performed in the presence of a chaperone Dilation: 1.5 Effacement (%): Thick Station: -2  Extremities: Normal range of motion.  Edema: None  Mental Status: Normal mood and affect. Normal behavior. Normal judgment and thought content.   Assessment and Plan:  Pregnancy: G2P0010 at [redacted]w[redacted]d 1. Supervision of other normal pregnancy, antepartum - Patient doing well, no complaints  - patient denies contractions at this time, has been using EPO and RRT x1  per day, encouraged to increase use to up to 3x per day  - Labor precautions and reasons to present to MAU discussed - membrane sweep performed today in office, discussed with patient will perform additional membrane sweep at next appointment - Anticipatory guidance on upcoming appointments with IOL scheduled for 4/10 - orders for admission placed   2. Herpes simplex infection in mother during pregnancy, antepartum - continue valtrex supression   3. Large for gestational age fetus in third trimester - EFW of 4137g at 31 weeks    Term labor symptoms and general obstetric precautions including but not limited to vaginal bleeding, contractions, leaking of fluid and fetal movement were reviewed in detail with the patient. Please refer to After Visit Summary for other counseling recommendations.   Return in about 1 week (around 07/12/2019) for ROB, NST.  Future Appointments  Date Time Provider Department Center  07/12/2019  3:00 PM Left[place]-Kirby, Wilmer Floor, CNM [facility]    Sharyon Cable, CNM

## 2019-07-05 NOTE — Progress Notes (Signed)
   Induction Assessment Scheduling Form: Fax to [facility]:  [phone]  Sarah Villanueva                                                                                   DOB:  [birth year]                                                            MRN:  [id number]                                                                     Phone #: [phone]                        Provider:  Ronnette Juniper  (Faculty Practice)  GP:  [id number]                                                            Estimated Date of Delivery: 07/12/19  Dating Criteria: LMP    Medical Indications for induction:  Post dates and LGA Admission Date/Time:  4/10 @ [facility] Gestational age on admission:  24.4   Filed Weights   07/05/19 1548  Weight: 263 lb (119.3 kg)   HIV:  Non Reactive (01/05 0909) GBS:  NEGATIVE  Dilation: 1.5 Effacement (%): Thick Station: -2  Method of induction(proposed):  FB and cytotec   Scheduling Provider Signature:  Sharyon Cable, [place]                                            Today's Date:  07/05/2019

## 2019-07-06 ENCOUNTER — Telehealth (HOSPITAL_COMMUNITY): Payer: Self-pay | Admitting: *Deleted

## 2019-07-06 NOTE — Telephone Encounter (Signed)
Preadmission screen  

## 2019-07-11 ENCOUNTER — Other Ambulatory Visit (HOSPITAL_COMMUNITY): Payer: Self-pay | Admitting: Student

## 2019-07-12 ENCOUNTER — Inpatient Hospital Stay (HOSPITAL_COMMUNITY): Admission: RE | Admit: 2019-07-12 | Payer: Medicaid Other | Source: Home / Self Care

## 2019-07-12 ENCOUNTER — Telehealth (HOSPITAL_COMMUNITY): Payer: Self-pay | Admitting: *Deleted

## 2019-07-12 ENCOUNTER — Other Ambulatory Visit: Payer: Self-pay

## 2019-07-12 ENCOUNTER — Ambulatory Visit (INDEPENDENT_AMBULATORY_CARE_PROVIDER_SITE_OTHER): Payer: Medicaid Other | Admitting: Advanced Practice Midwife

## 2019-07-12 VITALS — BP 113/72 | HR 77 | Wt 265.0 lb

## 2019-07-12 DIAGNOSIS — O3663X0 Maternal care for excessive fetal growth, third trimester, not applicable or unspecified: Secondary | ICD-10-CM

## 2019-07-12 DIAGNOSIS — O98519 Other viral diseases complicating pregnancy, unspecified trimester: Secondary | ICD-10-CM

## 2019-07-12 DIAGNOSIS — B009 Herpesviral infection, unspecified: Secondary | ICD-10-CM

## 2019-07-12 DIAGNOSIS — Z3480 Encounter for supervision of other normal pregnancy, unspecified trimester: Secondary | ICD-10-CM

## 2019-07-12 NOTE — Telephone Encounter (Signed)
Preadmission screen  

## 2019-07-12 NOTE — Progress Notes (Signed)
   PRENATAL VISIT NOTE  Subjective:  Sarah Villanueva is a 29 y.o. G2P0010 at [redacted]w[redacted]d being seen today for ongoing prenatal care.  She is currently monitored for the following issues for this low-risk pregnancy and has Supervision of normal pregnancy; Obesity in pregnancy; Not immune to rubella; Cystic fibrosis carrier; Herpes simplex infection during pregnancy; Trichomonal vaginitis in pregnancy; Excess weight gain in pregnancy; Recurrent genital HSV (herpes simplex virus) infection; and Large for gestational age fetus in third trimester on their problem list.  Patient reports no complaints.  Contractions: Not present. Vag. Bleeding: None.  Movement: Present. Denies leaking of fluid.   The following portions of the patient's history were reviewed and updated as appropriate: allergies, current medications, past family history, past medical history, past social history, past surgical history and problem list.   Objective:   Vitals:   07/12/19 1508  BP: 113/72  Pulse: 77  Weight: 265 lb (120.2 kg)    Fetal Status: Fetal Heart Rate (bpm): NST-R Fundal Height: 41 cm Movement: Present     General:  Alert, oriented and cooperative. Patient is in no acute distress.  Skin: Skin is warm and dry. No rash noted.   Cardiovascular: Normal heart rate noted  Respiratory: Normal respiratory effort, no problems with respiration noted  Abdomen: Soft, gravid, appropriate for gestational age.  Pain/Pressure: Absent     Pelvic: Cervical exam performed in the presence of a chaperone Dilation: 1.5 Effacement (%): 60 Station: -2  Extremities: Normal range of motion.  Edema: None  Mental Status: Normal mood and affect. Normal behavior. Normal judgment and thought content.   Assessment and Plan:  Pregnancy: G2P0010 at [redacted]w[redacted]d 1. Supervision of other normal pregnancy, antepartum --Anticipatory guidance about next visits/weeks of pregnancy given. --NST reactive today with baseline 125.  No contractions on toco or  to palpation. --Cervix softer and thinner than previous exam, fetus vertex and well applied. Membranes swept at today's visit. Pt to continue doing [employer] daily.  --IOL on 4/10 at midnight.   --Pt to come to MAU 4/10 at noon for foley bulb placement  2. Large for gestational age fetus in third trimester --EFW 4137g at 36 weeks.  Pt 5'11". Fundal height wnl.   3. Herpes simplex infection in mother during pregnancy, antepartum --On Valtrex, no s/sx of outbreak  Term labor symptoms and general obstetric precautions including but not limited to vaginal bleeding, contractions, leaking of fluid and fetal movement were reviewed in detail with the patient. Please refer to After Visit Summary for other counseling recommendations.   No follow-ups on file.  Future Appointments  Date Time Provider Department Center  07/14/2019  9:45 AM [facility]ENING [facility] None  07/16/2019 12:00 AM [facility] SCHED ROOM [facility] None    Sharen Counter, CNM

## 2019-07-13 ENCOUNTER — Telehealth (HOSPITAL_COMMUNITY): Payer: Self-pay | Admitting: *Deleted

## 2019-07-13 NOTE — Telephone Encounter (Signed)
Preadmission screen  

## 2019-07-14 ENCOUNTER — Other Ambulatory Visit (HOSPITAL_COMMUNITY)
Admission: RE | Admit: 2019-07-14 | Discharge: 2019-07-14 | Disposition: A | Discharge status: Home / Self Care | Payer: Medicaid Other | Source: Ambulatory Visit | Attending: Family Medicine | Admitting: Family Medicine

## 2019-07-14 DIAGNOSIS — Z20822 Contact with and (suspected) exposure to covid-19: Secondary | ICD-10-CM | POA: Diagnosis not present

## 2019-07-14 DIAGNOSIS — Z01812 Encounter for preprocedural laboratory examination: Secondary | ICD-10-CM | POA: Insufficient documentation

## 2019-07-14 LAB — SARS CORONAVIRUS 2 (TAT 6-24 HRS): SARS Coronavirus 2: NEGATIVE

## 2019-07-15 ENCOUNTER — Other Ambulatory Visit: Payer: Self-pay | Admitting: Advanced Practice Midwife

## 2019-07-16 ENCOUNTER — Encounter (HOSPITAL_COMMUNITY): Payer: Self-pay | Admitting: Family Medicine

## 2019-07-16 ENCOUNTER — Inpatient Hospital Stay (HOSPITAL_COMMUNITY)
Admission: AD | Admit: 2019-07-16 | Discharge: 2019-07-18 | DRG: 806 | Disposition: A | Discharge status: Home / Self Care | Payer: Medicaid Other | Attending: Obstetrics and Gynecology | Admitting: Obstetrics and Gynecology

## 2019-07-16 ENCOUNTER — Other Ambulatory Visit: Payer: Self-pay

## 2019-07-16 ENCOUNTER — Inpatient Hospital Stay (HOSPITAL_COMMUNITY): Payer: Medicaid Other | Admitting: Anesthesiology

## 2019-07-16 ENCOUNTER — Inpatient Hospital Stay (HOSPITAL_COMMUNITY): Payer: Medicaid Other

## 2019-07-16 DIAGNOSIS — A6000 Herpesviral infection of urogenital system, unspecified: Secondary | ICD-10-CM | POA: Diagnosis present

## 2019-07-16 DIAGNOSIS — O9832 Other infections with a predominantly sexual mode of transmission complicating childbirth: Secondary | ICD-10-CM | POA: Diagnosis present

## 2019-07-16 DIAGNOSIS — O3663X0 Maternal care for excessive fetal growth, third trimester, not applicable or unspecified: Secondary | ICD-10-CM

## 2019-07-16 DIAGNOSIS — O23593 Infection of other part of genital tract in pregnancy, third trimester: Secondary | ICD-10-CM

## 2019-07-16 DIAGNOSIS — Z141 Cystic fibrosis carrier: Secondary | ICD-10-CM

## 2019-07-16 DIAGNOSIS — A5901 Trichomonal vulvovaginitis: Secondary | ICD-10-CM | POA: Diagnosis present

## 2019-07-16 DIAGNOSIS — Z6832 Body mass index (BMI) 32.0-32.9, adult: Secondary | ICD-10-CM | POA: Diagnosis present

## 2019-07-16 DIAGNOSIS — Z789 Other specified health status: Secondary | ICD-10-CM | POA: Diagnosis present

## 2019-07-16 DIAGNOSIS — Z3A40 40 weeks gestation of pregnancy: Secondary | ICD-10-CM

## 2019-07-16 DIAGNOSIS — O2603 Excessive weight gain in pregnancy, third trimester: Secondary | ICD-10-CM

## 2019-07-16 DIAGNOSIS — O480 Post-term pregnancy: Secondary | ICD-10-CM

## 2019-07-16 DIAGNOSIS — O2600 Excessive weight gain in pregnancy, unspecified trimester: Secondary | ICD-10-CM | POA: Diagnosis present

## 2019-07-16 DIAGNOSIS — O99214 Obesity complicating childbirth: Secondary | ICD-10-CM | POA: Diagnosis present

## 2019-07-16 DIAGNOSIS — E669 Obesity, unspecified: Secondary | ICD-10-CM | POA: Diagnosis present

## 2019-07-16 DIAGNOSIS — Z3480 Encounter for supervision of other normal pregnancy, unspecified trimester: Secondary | ICD-10-CM

## 2019-07-16 DIAGNOSIS — O99210 Obesity complicating pregnancy, unspecified trimester: Secondary | ICD-10-CM | POA: Diagnosis present

## 2019-07-16 DIAGNOSIS — B009 Herpesviral infection, unspecified: Secondary | ICD-10-CM | POA: Diagnosis present

## 2019-07-16 HISTORY — DX: Obesity, unspecified: E66.9

## 2019-07-16 HISTORY — DX: Herpesviral infection, unspecified: B00.9

## 2019-07-16 HISTORY — DX: Trichomonal vulvovaginitis: A59.01

## 2019-07-16 HISTORY — DX: Cystic fibrosis carrier: Z14.1

## 2019-07-16 LAB — TYPE AND SCREEN
ABO/RH(D): A POS
Antibody Screen: NEGATIVE

## 2019-07-16 LAB — CBC
HCT: 33.9 % — ABNORMAL LOW (ref 36.0–46.0)
Hemoglobin: 10.8 g/dL — ABNORMAL LOW (ref 12.0–15.0)
MCH: 26.3 pg (ref 26.0–34.0)
MCHC: 31.9 g/dL (ref 30.0–36.0)
MCV: 82.7 fL (ref 80.0–100.0)
Platelets: 307 10*3/uL (ref 150–400)
RBC: 4.1 MIL/uL (ref 3.87–5.11)
RDW: 14.2 % (ref 11.5–15.5)
WBC: 13.4 10*3/uL — ABNORMAL HIGH (ref 4.0–10.5)
nRBC: 0 % (ref 0.0–0.2)

## 2019-07-16 LAB — RPR: RPR Ser Ql: NONREACTIVE

## 2019-07-16 LAB — ABO/RH: ABO/RH(D): A POS

## 2019-07-16 MED ORDER — EPHEDRINE 5 MG/ML INJ: 10.0000 mg | INTRAVENOUS | Status: DC | PRN

## 2019-07-16 MED ORDER — SIMETHICONE 80 MG PO CHEW: 80.0000 mg | CHEWABLE_TABLET | ORAL | Status: DC | PRN

## 2019-07-16 MED ORDER — LACTATED RINGERS IV SOLN
500.0000 mL | INTRAVENOUS | Status: DC | PRN
  Administered 2019-07-16: 500 mL via INTRAVENOUS

## 2019-07-16 MED ORDER — FENTANYL-BUPIVACAINE-NACL 0.5-0.125-0.9 MG/250ML-% EP SOLN: 12.0000 mL/h | EPIDURAL | Status: DC | PRN

## 2019-07-16 MED ORDER — PHENYLEPHRINE 40 MCG/ML (10ML) SYRINGE FOR IV PUSH (FOR BLOOD PRESSURE SUPPORT): 80.0000 ug | PREFILLED_SYRINGE | INTRAVENOUS | Status: DC | PRN

## 2019-07-16 MED ORDER — DIPHENHYDRAMINE HCL 50 MG/ML IJ SOLN: 12.5000 mg | INTRAMUSCULAR | Status: DC | PRN

## 2019-07-16 MED ORDER — LACTATED RINGERS IV SOLN
500.0000 mL | Freq: Once | INTRAVENOUS | Status: AC
  Administered 2019-07-16: 500 mL via INTRAVENOUS

## 2019-07-16 MED ORDER — TERBUTALINE SULFATE 1 MG/ML IJ SOLN: 0.2500 mg | Freq: Once | INTRAMUSCULAR | Status: DC | PRN

## 2019-07-16 MED ORDER — BENZOCAINE-MENTHOL 20-0.5 % EX AERO
1.0000 "application " | INHALATION_SPRAY | CUTANEOUS | Status: DC | PRN
  Administered 2019-07-17 – 2019-07-18 (×2): 1 via TOPICAL
  Filled 2019-07-16 (×2): qty 56

## 2019-07-16 MED ORDER — COCONUT OIL OIL: 1.0000 "application " | TOPICAL_OIL | Status: DC | PRN

## 2019-07-16 MED ORDER — LIDOCAINE HCL (PF) 1 % IJ SOLN
30.0000 mL | INTRAMUSCULAR | Status: AC | PRN
  Administered 2019-07-16 (×2): 6 mL via SUBCUTANEOUS

## 2019-07-16 MED ORDER — PHENYLEPHRINE 40 MCG/ML (10ML) SYRINGE FOR IV PUSH (FOR BLOOD PRESSURE SUPPORT)
PREFILLED_SYRINGE | INTRAVENOUS | Status: AC
  Filled 2019-07-16: qty 10

## 2019-07-16 MED ORDER — OXYTOCIN 40 UNITS IN NORMAL SALINE INFUSION - SIMPLE MED
1.0000 m[IU]/min | INTRAVENOUS | Status: DC
  Administered 2019-07-16: 2 m[IU]/min via INTRAVENOUS

## 2019-07-16 MED ORDER — DIPHENHYDRAMINE HCL 25 MG PO CAPS: 25.0000 mg | ORAL_CAPSULE | Freq: Four times a day (QID) | ORAL | Status: DC | PRN

## 2019-07-16 MED ORDER — ONDANSETRON HCL 4 MG/2ML IJ SOLN: 4.0000 mg | Freq: Four times a day (QID) | INTRAMUSCULAR | Status: DC | PRN

## 2019-07-16 MED ORDER — SODIUM CHLORIDE (PF) 0.9 % IJ SOLN
INTRAMUSCULAR | Status: DC | PRN
  Administered 2019-07-16: 12 mL/h via EPIDURAL

## 2019-07-16 MED ORDER — TETANUS-DIPHTH-ACELL PERTUSSIS 5-2.5-18.5 LF-MCG/0.5 IM SUSP: 0.5000 mL | Freq: Once | INTRAMUSCULAR | Status: DC

## 2019-07-16 MED ORDER — ONDANSETRON HCL 4 MG PO TABS: 4.0000 mg | ORAL_TABLET | ORAL | Status: DC | PRN

## 2019-07-16 MED ORDER — PRENATAL MULTIVITAMIN CH
1.0000 | ORAL_TABLET | Freq: Every day | ORAL | Status: DC
  Administered 2019-07-17 – 2019-07-18 (×2): 1 via ORAL
  Filled 2019-07-16 (×2): qty 1

## 2019-07-16 MED ORDER — MISOPROSTOL 25 MCG QUARTER TABLET
25.0000 ug | ORAL_TABLET | ORAL | Status: DC | PRN
  Filled 2019-07-16: qty 1

## 2019-07-16 MED ORDER — FENTANYL-BUPIVACAINE-NACL 0.5-0.125-0.9 MG/250ML-% EP SOLN
EPIDURAL | Status: AC
  Filled 2019-07-16: qty 250

## 2019-07-16 MED ORDER — LACTATED RINGERS IV SOLN: INTRAVENOUS | Status: DC

## 2019-07-16 MED ORDER — MISOPROSTOL 50MCG HALF TABLET
50.0000 ug | ORAL_TABLET | ORAL | Status: DC | PRN
  Administered 2019-07-16 (×3): 50 ug via BUCCAL
  Filled 2019-07-16 (×3): qty 1

## 2019-07-16 MED ORDER — SOD CITRATE-CITRIC ACID 500-334 MG/5ML PO SOLN: 30.0000 mL | ORAL | Status: DC | PRN

## 2019-07-16 MED ORDER — ONDANSETRON HCL 4 MG/2ML IJ SOLN: 4.0000 mg | INTRAMUSCULAR | Status: DC | PRN

## 2019-07-16 MED ORDER — ACETAMINOPHEN 325 MG PO TABS: 650.0000 mg | ORAL_TABLET | ORAL | Status: DC | PRN

## 2019-07-16 MED ORDER — OXYTOCIN BOLUS FROM INFUSION
500.0000 mL | Freq: Once | INTRAVENOUS | Status: AC
  Administered 2019-07-16: 500 mL via INTRAVENOUS

## 2019-07-16 MED ORDER — SENNOSIDES-DOCUSATE SODIUM 8.6-50 MG PO TABS
2.0000 | ORAL_TABLET | ORAL | Status: DC
  Administered 2019-07-17 (×2): 2 via ORAL
  Filled 2019-07-16 (×2): qty 2

## 2019-07-16 MED ORDER — ACETAMINOPHEN 325 MG PO TABS: 650.0000 mg | ORAL_TABLET | Freq: Four times a day (QID) | ORAL | Status: DC | PRN

## 2019-07-16 MED ORDER — OXYTOCIN 40 UNITS IN NORMAL SALINE INFUSION - SIMPLE MED
2.5000 [IU]/h | INTRAVENOUS | Status: DC
  Administered 2019-07-16: 2.5 [IU]/h via INTRAVENOUS
  Filled 2019-07-16: qty 1000

## 2019-07-16 MED ORDER — ONDANSETRON HCL 4 MG/2ML IJ SOLN: 4.0000 mg | Freq: Four times a day (QID) | INTRAMUSCULAR | Status: DC

## 2019-07-16 MED ORDER — DIBUCAINE (PERIANAL) 1 % EX OINT: 1.0000 "application " | TOPICAL_OINTMENT | CUTANEOUS | Status: DC | PRN

## 2019-07-16 MED ORDER — IBUPROFEN 600 MG PO TABS
600.0000 mg | ORAL_TABLET | Freq: Three times a day (TID) | ORAL | Status: DC | PRN
  Administered 2019-07-17 – 2019-07-18 (×3): 600 mg via ORAL
  Filled 2019-07-16 (×4): qty 1

## 2019-07-16 MED ORDER — FENTANYL CITRATE (PF) 100 MCG/2ML IJ SOLN
100.0000 ug | INTRAMUSCULAR | Status: DC | PRN
  Administered 2019-07-16 (×2): 100 ug via INTRAVENOUS
  Filled 2019-07-16 (×2): qty 2

## 2019-07-16 MED ORDER — WITCH HAZEL-GLYCERIN EX PADS: 1.0000 "application " | MEDICATED_PAD | CUTANEOUS | Status: DC | PRN

## 2019-07-16 MED ORDER — MEASLES, MUMPS & RUBELLA VAC IJ SOLR: 0.5000 mL | Freq: Once | INTRAMUSCULAR | Status: DC

## 2019-07-16 NOTE — H&P (Addendum)
OBSTETRIC ADMISSION HISTORY AND PHYSICAL  Sarah Villanueva is a 29 y.o. female G2P0010 with IUP at 93w3dby ultrasound presenting for IOL for postdates/LGA. She reports  +FMs, No LOF, no VB, no contractions, no blurry vision, headaches or peripheral edema, or RUQ pain.  She has not had an HSV outbreak in over a year. She has been taking her valtrex medication. She plans on  breastfeeding. She requests birth control but is undecided on which type.  [facility]  Dating: By u/s --->  Estimated Date of Delivery: 07/12/19  Sono:    _0 , CWD, normal anatomy, cephalic presentation, longitudinal lie, 4137g, 99% EFW  Prenatal History/Complications: Hx of HSV LGA Hx of Trich CF Carrier Rubella Non-Immune Maternal Obesity   Past Medical History: History reviewed. No pertinent past medical history.  Past Surgical History: History reviewed. No pertinent surgical history.  Obstetrical History: OB History    Gravida  2   Para      Term      Preterm      AB  1   Living        SAB      TAB  1   Ectopic      Multiple      Live Births              Social History Social History   Socioeconomic History  . Marital status: Single    Spouse name: Not on file  . Number of children: Not on file  . Years of education: Not on file  . Highest education level: Not on file  Occupational History  . Not on file  Tobacco Use  . Smoking status: Never Smoker  . Smokeless tobacco: Never Used  Substance and Sexual Activity  . Alcohol use: Not Currently    Comment: occ  . Drug use: Never  . Sexual activity: Yes    Birth control/protection: None  Other Topics Concern  . Not on file  Social History Narrative  . Not on file   Social Determinants of Health   Financial Resource Strain:   . Difficulty of Paying Living Expenses:   Food Insecurity:   . Worried About RCharity fundraiserin the Last Year:   . RArboriculturistin the Last Year:    Transportation Needs:   . LFilm/video editor(Medical):   .Marland KitchenLack of Transportation (Non-Medical):   Physical Activity:   . Days of Exercise per Week:   . Minutes of Exercise per Session:   Stress:   . Feeling of Stress :   Social Connections:   . Frequency of Communication with Friends and Family:   . Frequency of Social Gatherings with Friends and Family:   . Attends Religious Services:   . Active Member of Clubs or Organizations:   . Attends CArchivistMeetings:   .Marland KitchenMarital Status:     Family History: Family History  Problem Relation Age of Onset  . Hypertension Paternal Grandfather   . Diabetes Paternal Grandfather   . Hypertension Paternal Grandmother   . Diabetes Paternal Grandmother   . Diabetes Father   . Hypertension Father   . Cervical cancer Mother     Allergies: No Known Allergies  Medications Prior to Admission  Medication Sig Dispense Refill Last Dose  . Prenatal Vit-Fe Fumarate-FA (PRENATAL VITAMINS PO) Take by mouth.   07/15/2019 at Unknown time  . valACYclovir (VALTREX) 500 MG tablet Take  1 tablet (500 mg total) by mouth 2 (two) times daily. 60 tablet 1 07/15/2019 at Unknown time  . Blood Pressure Monitoring (BLOOD PRESSURE CUFF) MISC 1 Device by Does not apply route as needed. 1 each 0   . Blood Pressure Monitoring (BLOOD PRESSURE KIT) DEVI 1 Device by Does not apply route once a week. Pt to take BP weekly during pregnancy 1 Device 0   . Elastic Bandages & Supports (COMFORT FIT MATERNITY SUPP LG) MISC 1 Units by Does not apply route daily. 1 each 0      Review of Systems   All systems reviewed and negative except as stated in HPI  Blood pressure 117/72, pulse 89, temperature 98.6 F (37 C), temperature source Oral, resp. rate 16, height '5\' 11"'$  (1.803 m), weight 121.2 kg, last menstrual period 10/05/2018, SpO2 99 %. General appearance: alert, cooperative, appears stated age and no distress Lungs: clear to auscultation bilaterally Heart:  regular rate and rhythm Abdomen: soft, non-tender; bowel sounds normal Pelvic: no lesions or ulcers.   Extremities: Homans sign is negative, no sign of DVT DTR's equal. No clonus.  Presentation: cephalic Fetal monitoringBaseline: 140 bpm, Variability: Good {> 6 bpm) and Accelerations: Reactive Uterine activity: has felt contractions only once today.,  Dilation: 1 Effacement (%): 50 Station: -2 Exam by:: Dr. Marice Potter   Prenatal labs: ABO, Rh: --/--/PENDING (04/10 0125) Antibody: PENDING (04/10 0125) Rubella: <0.90 (09/10 1328) NON immune RPR: Non Reactive (01/05 0909)  HBsAg: NON-REACTIVE (09/10 1328)  HIV: Non Reactive (01/05 0909)  GBS:   Neg 2 hr Glucola WNL Genetic screening  Low risk NIPS, AFP wnl Anatomy US normal except for LGA  Prenatal Transfer Tool  Maternal Diabetes: No Genetic Screening: Normal Maternal Ultrasounds/Referrals: Other:LGA Fetal Ultrasounds or other Referrals:  None Maternal Substance Abuse:  No Significant Maternal Medications:  None Significant Maternal Lab Results: Group B Strep negative  Results for orders placed or performed during the hospital encounter of 07/16/19 (from the past 24 hour(s))  CBC   Collection Time: 07/16/19  1:25 AM  Result Value Ref Range   WBC 13.4 (H) 4.0 - 10.5 K/uL   RBC 4.10 3.87 - 5.11 MIL/uL   Hemoglobin 10.8 (L) 12.0 - 15.0 g/dL   HCT 33.9 (L) 36.0 - 46.0 %   MCV 82.7 80.0 - 100.0 fL   MCH 26.3 26.0 - 34.0 pg   MCHC 31.9 30.0 - 36.0 g/dL   RDW 14.2 11.5 - 15.5 %   Platelets 307 150 - 400 K/uL   nRBC 0.0 0.0 - 0.2 %  Type and screen   Collection Time: 07/16/19  1:25 AM  Result Value Ref Range   ABO/RH(D) PENDING    Antibody Screen PENDING    Sample Expiration      07/19/2019,2359 Performed at [facility], [place] [address] [place], [place] [address]     Patient Active Problem List   Diagnosis Date Noted  . Post-dates pregnancy 07/16/2019  . Recurrent genital HSV (herpes simplex virus) infection  05/31/2019  . Large for gestational age fetus in third trimester 05/31/2019  . Excess weight gain in pregnancy 04/26/2019  . Herpes simplex infection during pregnancy 03/07/2019  . Trichomonal vaginitis in pregnancy 03/07/2019  . Cystic fibrosis carrier 02/22/2019  . Not immune to rubella 01/05/2019  . Supervision of normal pregnancy 12/16/2018  . Obesity in pregnancy 12/16/2018    Assessment/Plan:  Sarah Villanueva is a 29 y.o. G2P0010 at 37w3dhere for IOL for postdates. EFW  is 99th%ile at 36 weeks. Pregnancy complicated by hx of HSV, LGA (99%ile).   #Labor:IOL. FB placed.  Pitocin when appropriate.  #Pain: Wants to try without epidural.  Discussed other options  #FWB: Cat I #ID: GBS negative #MOF: breast #MOC:undecided #Circ:  no # hx of HSV - no outbreaks.  On exam today there were no lesions.  Has been taking suppressive therapy since 35 weeks.  #LGA: at 36w EFW was 4137g (99%ile).   Benay Pike, MD  07/16/2019, 1:59 AM  I saw and evaluated the patient. I agree with the findings and the plan of care as documented in the resident's note. Vertex by exam. Foley bulb placed manually and filled with 60 mL water; patient tolerated well. Give Cytotec. AROM/Pit PRN. No lesions on vaginal exam and patient without symptoms. EFW 4300g. Patient is 5'11" and FOB is 6'3". Pelvis feels adequate. Anticipate SVD. Needs MMR PP.   Barrington Ellison, MD [facility] Family Medicine Fellow, [facility] for [employer], [place]

## 2019-07-16 NOTE — Discharge Summary (Signed)
Postpartum Discharge Summary     Patient Name: Sarah Villanueva DOB: Jul 23, 1990 MRN: [id number]  Date of admission: 07/16/2019 Delivering Provider: Chauncey Mann   Date of discharge: 07/18/2019  Admitting diagnosis: Post-dates pregnancy [O48.0] Intrauterine pregnancy: [redacted]w[redacted]d    Secondary diagnosis:  Active Problems:   Obesity in pregnancy   Not immune to rubella   Cystic fibrosis carrier   Herpes simplex infection during pregnancy   Trichomonal vaginitis in pregnancy   Excess weight gain in pregnancy   Recurrent genital HSV (herpes simplex virus) infection   Large for gestational age fetus in third trimester   Post-dates pregnancy  Additional problems: None     Discharge diagnosis: Term Pregnancy Delivered                                                                                                Post partum procedures:None  Augmentation: AROM, Pitocin, Cytotec and Foley Balloon  Complications: None  Hospital course:  Induction of Labor With Vaginal Delivery   29y.o. yo G2P0010 at 435w4das admitted to the hospital 07/16/2019 for induction of labor.  Indication for induction: Postdates.  Patient had an uncomplicated labor course as follows: Initial SVE 1/50/-2. Patient received Cytotec, Foley bulb, Pitocin and AROM. Received epidural and progressed to complete.  Membrane Rupture Time/Date: 6:05 PM ,07/16/2019   Intrapartum Procedures: Episiotomy: None [1]                                         Lacerations:  1st degree [2];Periurethral [8]  Patient had delivery of a Viable infant.  Information for the patient's newborn:  LaZhanae, Proffit0[[id number]]Delivery Method: Vag-Spont    07/16/2019  Details of delivery can be found in separate delivery note.  Patient had a routine postpartum course. Patient is discharged home 07/18/19. Delivery time: 8:51 PM    Magnesium Sulfate received: No BMZ received: No Rhophylac:No MMR:No Transfusion:No  Physical exam   Vitals:   07/17/19 0920 07/17/19 1240 07/17/19 2235 07/18/19 0502  BP: 111/65 116/83 130/83 111/75  Pulse: 64 66 88 84  Resp: '18 16 18 18  ' Temp:  98.8 F (37.1 C) 98.5 F (36.9 C) 97.8 F (36.6 C)  TempSrc:   Oral Oral  SpO2: 99% 99%  99%  Weight:      Height:       General: alert, cooperative and no distress Lochia: appropriate Uterine Fundus: firm Incision: N/A DVT Evaluation: No evidence of DVT seen on physical exam. Labs: Lab Results  Component Value Date   WBC 13.4 (H) 07/16/2019   HGB 10.8 (L) 07/16/2019   HCT 33.9 (L) 07/16/2019   MCV 82.7 07/16/2019   PLT 307 07/16/2019   CMP Latest Ref Rng & Units 12/16/2018  Glucose 65 - 99 mg/dL 88  BUN 7 - 25 mg/dL 11  Creatinine 0.50 - 1.10 mg/dL 0.69  Sodium 135 - 146 mmol/L 137  Potassium 3.5 - 5.3 mmol/L 4.0  Chloride 98 - 110  mmol/L 103  CO2 20 - 32 mmol/L 27  Calcium 8.6 - 10.2 mg/dL 9.9  Total Protein 6.1 - 8.1 g/dL 7.0  Total Bilirubin 0.2 - 1.2 mg/dL 0.5  Alkaline Phos 38 - 126 U/L -  AST 10 - 30 U/L 14  ALT 6 - 29 U/L 11   Edinburgh Score: Edinburgh Postnatal Depression Scale Screening Tool 07/16/2019  I have been able to laugh and see the funny side of things. (No Data)    Discharge instruction: per After Visit Summary and "Baby and Me Booklet".  After visit meds:  Allergies as of 07/18/2019   No Known Allergies     Medication List    STOP taking these medications   valACYclovir 500 MG tablet Commonly known as: Valtrex     TAKE these medications   acetaminophen 325 MG tablet Commonly known as: Tylenol Take 2 tablets (650 mg total) by mouth every 6 (six) hours as needed (for pain scale < 4).   Blood Pressure Kit Devi 1 Device by Does not apply route once a week. Pt to take BP weekly during pregnancy   Blood Pressure Cuff Misc 1 Device by Does not apply route as needed.   Comfort Fit Maternity Supp Lg Misc 1 Units by Does not apply route daily.   ibuprofen 600 MG tablet Commonly known  as: ADVIL Take 1 tablet (600 mg total) by mouth every 8 (eight) hours as needed for mild pain.   PRENATAL VITAMINS PO Take by mouth.   senna-docusate 8.6-50 MG tablet Commonly known as: Senokot-S Take 2 tablets by mouth daily. Start taking on: July 19, 2019       Diet: routine diet  Activity: Advance as tolerated. Pelvic rest for 6 weeks.   Outpatient follow up:4 weeks Follow up Appt:No future appointments. Follow up Visit:    Please schedule this patient for Postpartum visit in: 4 weeks with the following provider: Any provider Virtual For C/S patients schedule nurse incision check in weeks 2 weeks: no Low risk pregnancy complicated by: none Delivery mode:  SVD Anticipated Birth Control:  none PP Procedures needed: None  Schedule Integrated BH visit: no     Newborn Data: Live born female  Birth Weight: 4346g  APGAR: 66, 9  Newborn Delivery   Birth date/time: 07/16/2019 20:51:00 Delivery type: Vaginal, Spontaneous      Baby Feeding: Breast Disposition:home with mother   07/18/2019 Chauncey Mann, MD

## 2019-07-16 NOTE — Progress Notes (Signed)
LABOR PROGRESS NOTE  Sarah Villanueva is a 29 y.o. G2P0010 at [redacted]w[redacted]d  admitted for post-dates IOL.  Subjective: Contractions getting more painful  Objective: BP 127/81   Pulse 81   Temp 98 F (36.7 C) (Oral)   Resp 18   Ht 5\' 11"  (1.803 m)   Wt 121.2 kg   LMP 10/05/2018   SpO2 99%   BMI 37.27 kg/m  or  Vitals:   07/16/19 1600 07/16/19 1630 07/16/19 1700 07/16/19 1730  BP: 115/82 118/79 130/78 127/81  Pulse: 65 78 67 81  Resp: 18 18 18 18   Temp:   98 F (36.7 C)   TempSrc:   Oral   SpO2:      Weight:      Height:         Dilation: 4 Effacement (%): 80 Cervical Position: Middle Station: -1 Presentation: Vertex Exam by:: Dr FHT: baseline rate 140, moderate varibility, -acel, -decel Toco: q1-2 min  Labs: Lab Results  Component Value Date   WBC 13.4 (H) 07/16/2019   HGB 10.8 (L) 07/16/2019   HCT 33.9 (L) 07/16/2019   MCV 82.7 07/16/2019   PLT 307 07/16/2019    Patient Active Problem List   Diagnosis Date Noted  . Post-dates pregnancy 07/16/2019  . Recurrent genital HSV (herpes simplex virus) infection 05/31/2019  . Large for gestational age fetus in third trimester 05/31/2019  . Excess weight gain in pregnancy 04/26/2019  . Herpes simplex infection during pregnancy 03/07/2019  . Trichomonal vaginitis in pregnancy 03/07/2019  . Cystic fibrosis carrier 02/22/2019  . Not immune to rubella 01/05/2019  . Supervision of normal pregnancy 12/16/2018  . Obesity in pregnancy 12/16/2018    Assessment / Plan: 29 y.o. G2P0010 at [redacted]w[redacted]d here for for post-dates IOL.  Labor: s/p FB, miso x3, started pitocin at 1445. Unchanged this exam and difficult to trace contractions, AROM for clear and IUPC placed anteriorly (posterior placenta on most recent 30). Titrate pitocin PRN. Fetal Wellbeing:  Cat I Pain Control:  IV pain meds PRN, epidural per patient request GBS: neg Anticipated MOD:  SVD  ?Fetal macrosomia: Leopolds to ~4000g on my exam, patient 5'11" and  FOB 6'3" with adequate pelvis, anticipate NSVD  [redacted]w[redacted]d, MD/[facility] OB Fellow  07/16/2019, 6:12 PM

## 2019-07-16 NOTE — Progress Notes (Signed)
LABOR PROGRESS NOTE  Sarah Villanueva is a 29 y.o. G2P0010 at [redacted]w[redacted]d  admitted for post-dates IOL.  Subjective: Feeling comfortable  Objective: BP 125/76   Pulse 73   Temp 98.6 F (37 C) (Axillary)   Resp 18   Ht 5\' 11"  (1.803 m)   Wt 121.2 kg   LMP 10/05/2018   SpO2 99%   BMI 37.27 kg/m  or  Vitals:   07/16/19 0300 07/16/19 0611 07/16/19 0654 07/16/19 0856  BP: 112/74 122/75  125/76  Pulse: 84 79  73  Resp: 18 17  18   Temp:   98.7 F (37.1 C) 98.6 F (37 C)  TempSrc:   Oral Axillary  SpO2:      Weight:      Height:         Dilation: 4 Effacement (%): 50 Cervical Position: Middle Station: -1 Presentation: Vertex Exam by:: , RN  FHT: baseline rate 160, moderate varibility, +acel, variable decel Toco: q2 min  Labs: Lab Results  Component Value Date   WBC 13.4 (H) 07/16/2019   HGB 10.8 (L) 07/16/2019   HCT 33.9 (L) 07/16/2019   MCV 82.7 07/16/2019   PLT 307 07/16/2019    Patient Active Problem List   Diagnosis Date Noted  . Post-dates pregnancy 07/16/2019  . Recurrent genital HSV (herpes simplex virus) infection 05/31/2019  . Large for gestational age fetus in third trimester 05/31/2019  . Excess weight gain in pregnancy 04/26/2019  . Herpes simplex infection during pregnancy 03/07/2019  . Trichomonal vaginitis in pregnancy 03/07/2019  . Cystic fibrosis carrier 02/22/2019  . Not immune to rubella 01/05/2019  . Supervision of normal pregnancy 12/16/2018  . Obesity in pregnancy 12/16/2018    Assessment / Plan: 29 y.o. G2P0010 at [redacted]w[redacted]d here for for post-dates IOL.  Labor: s/p FB, miso x2. Last exam still with some thickness to cervix at 4/50/-1 and primip, favor one more dose of cytotec for cervical ripening given at 1020. Fetal Wellbeing:  Cat II for single variable but overall reassuring for moderate variability and accels Pain Control:  IV pain meds PRN, epidural per patient request GBS: neg Anticipated MOD:  SVD  ?Fetal macrosomia:  Leopolds to ~4000g on my exam, patient 5'11" and FOB 6'3" with adequate pelvis, anticipate NSVD  30, MD/[facility] OB Fellow  07/16/2019, 10:35 AM

## 2019-07-16 NOTE — Anesthesia Procedure Notes (Signed)
Epidural Patient location during procedure: OB Start time: 07/16/2019 7:28 PM End time: 07/16/2019 7:31 PM  Staffing Anesthesiologist: Leilani Able, MD Performed: anesthesiologist   Preanesthetic Checklist Completed: patient identified, IV checked, site marked, risks and benefits discussed, surgical consent, monitors and equipment checked, pre-op evaluation and timeout performed  Epidural Patient position: sitting Prep: DuraPrep and site prepped and draped Patient monitoring: continuous pulse ox and blood pressure Approach: midline Location: L3-L4 Injection technique: LOR air  Needle:  Needle type: Tuohy  Needle gauge: 17 G Needle length: 9 cm and 9 Needle insertion depth: 9 cm Catheter type: closed end flexible Catheter size: 19 Gauge Catheter at skin depth: 14 cm Test dose: negative and Other  Assessment Events: blood not aspirated, injection not painful, no injection resistance, no paresthesia and negative IV test  Additional Notes Reason for block:procedure for pain

## 2019-07-16 NOTE — Progress Notes (Signed)
Labor Progress Note Roselia Snipe is a 29 y.o. G2P0010 at [redacted]w[redacted]d presented for IOL for post-dates. S: Some cramping.   O:  BP 122/75   Pulse 79   Temp 98.6 F (37 C) (Oral)   Resp 17   Ht 5\' 11"  (1.803 m)   Wt 121.2 kg   LMP 10/05/2018   SpO2 99%   BMI 37.27 kg/m  EFM: 140, moderate variability, pos accels, no decels, reactive TOCO: q23m  CVE: Dilation: 1 Effacement (%): 50 Station: -2 Presentation: Vertex Exam by:: Dr. [email or web address]   A&P: 29 y.o. G2P0010 [redacted]w[redacted]d here for PD IOL. #Labor: Progressing well. Foley still in place. 2nd Cytotec given. Pit/AROM PRN. Anticipate SVD. #Pain: per patient request #FWB: Cat I #GBS negative  [redacted]w[redacted]d, MD 6:15 AM

## 2019-07-16 NOTE — Anesthesia Preprocedure Evaluation (Signed)
Anesthesia Evaluation  Patient identified by MRN, date of birth, ID band Patient awake    Reviewed: Allergy & Precautions, H&P , NPO status , Patient's Chart, lab work & pertinent test results  Airway Mallampati: II  TM Distance: >3 FB Neck ROM: full    Dental no notable dental hx. (+) Teeth Intact   Pulmonary neg pulmonary ROS,    Pulmonary exam normal breath sounds clear to auscultation       Cardiovascular negative cardio ROS Normal cardiovascular exam Rhythm:regular Rate:Normal     Neuro/Psych negative neurological ROS  negative psych ROS   GI/Hepatic negative GI ROS, Neg liver ROS,   Endo/Other  negative endocrine ROS  Renal/GU negative Renal ROS  negative genitourinary   Musculoskeletal  (+) Arthritis ,   Abdominal (+) + obese,   Peds  Hematology negative hematology ROS (+)   Anesthesia Other Findings   Reproductive/Obstetrics (+) Pregnancy                             Anesthesia Physical Anesthesia Plan  ASA: II  Anesthesia Plan: Epidural   Post-op Pain Management:    Induction:   PONV Risk Score and Plan:   Airway Management Planned:   Additional Equipment:   Intra-op Plan:   Post-operative Plan:   Informed Consent: I have reviewed the patients History and Physical, chart, labs and discussed the procedure including the risks, benefits and alternatives for the proposed anesthesia with the patient or authorized representative who has indicated his/her understanding and acceptance.       Plan Discussed with:   Anesthesia Plan Comments:         Anesthesia Quick Evaluation

## 2019-07-17 NOTE — Progress Notes (Signed)
Post Partum Day 1 Subjective: Patient reports feeling well. She is tolerating PO. She has not yet ambulated or voided. Lochia minimal.  Objective: Blood pressure 122/76, pulse 79, temperature 98 F (36.7 C), temperature source Oral, resp. rate 16, height 5\' 11"  (1.803 m), weight 121.2 kg, last menstrual period 10/05/2018, SpO2 100 %, unknown if currently breastfeeding.  Physical Exam:  General: alert, cooperative and appears stated age Lochia: appropriate Uterine Fundus: firm Incision: NA DVT Evaluation: No evidence of DVT seen on physical exam.  Recent Labs    07/16/19 0125  HGB 10.8*  HCT 33.9*    Assessment/Plan: Plan for discharge tomorrow  Vitals stable Breastfeeding Declines contraception   LOS: 1 day   09/15/19 07/17/2019, 6:28 AM

## 2019-07-17 NOTE — Lactation Note (Signed)
This note was copied from a baby's chart. Lactation Consultation Note Baby 5 hrs old.  Mom stated BF well after delivery but hasn't been to interested since. Mom has hand expressed and spoon fed some drops of colostrum. LC helped mom hand express and collected 3 ml colostrum.   Attempted to latch baby in football position. Baby had no interest in feeding.wouldn't suckle once or on my gloved finger.  Baby has significant recessed chin, needs to be flanged for latching. Mom stated it makes it harder for latching. LC attempted to spoon feed colostrum, baby had no interest in eating from spoon. LC used suction bulb to clear out a few bubbly salvia.   Newborn feeding habits, STS, I&O, milk storage, breast massage, supply and demand discussed.  Mom has large breast w/flat compressible nipple. Shells given to wear in am w/bra. Hand pump given for pre-pumping to evert nipples before latching.  Encouraged mom to rest while baby rest. Set alarm to try to feed baby q3 hrs if baby hasn't cued before that. Feed on demand. Mom encouraged to feed baby 8-12 times/24 hours and with feeding cues.   Encouraged mom to call for assistance or has questions. Lactation brochure given.  Patient Name: Boy Camiya Vinal [id number] Date: 07/17/2019 Reason for consult: Initial assessment;Primapara;Term   Maternal Data Has patient been taught Hand Expression?: Yes Does the patient have breastfeeding experience prior to this delivery?: No  Feeding Feeding Type: Breast Milk  LATCH Score Latch: Too sleepy or reluctant, no latch achieved, no sucking elicited.  Audible Swallowing: None  Type of Nipple: Flat  Comfort (Breast/Nipple): Soft / non-tender  Hold (Positioning): Full assist, staff holds infant at breast  LATCH Score: 3  Interventions Interventions: Breast feeding basics reviewed;Support pillows;Assisted with latch;Position options;Skin to skin;Expressed milk;Breast massage;Hand  express;Shells;Pre-pump if needed;Breast compression;Adjust position;Hand pump  Lactation Tools Discussed/Used Tools: Shells;Pump Shell Type: Inverted Breast pump type: Manual WIC Program: Yes Pump Review: Setup, frequency, and cleaning;Milk Storage Initiated by:: Peri Jefferson RN IBCLC Date initiated:: 07/17/19   Consult Status Consult Status: Follow-up Date: 07/17/19 Follow-up type: In-patient    Lovelace Cerveny, Diamond Nickel 07/17/2019, 2:27 AM

## 2019-07-17 NOTE — Anesthesia Postprocedure Evaluation (Signed)
Anesthesia Post Note  Patient: Sarah Villanueva  Procedure(s) Performed: AN AD HOC LABOR EPIDURAL     Patient location during evaluation: Mother Baby Anesthesia Type: Epidural Level of consciousness: awake and alert Pain management: pain level controlled Vital Signs Assessment: post-procedure vital signs reviewed and stable Respiratory status: spontaneous breathing, nonlabored ventilation and respiratory function stable Cardiovascular status: stable Postop Assessment: no headache, no backache and epidural receding Anesthetic complications: no    Last Vitals:  Vitals:   07/17/19 0000 07/17/19 0429  BP: 115/76 122/76  Pulse: 80 79  Resp: 16 16  Temp: 36.7 C 36.7 C  SpO2:      Last Pain:  Vitals:   07/17/19 0430  TempSrc:   PainSc: 0-No pain   Pain Goal: Patients Stated Pain Goal: 10(pt plans to go natural ) (07/16/19 0800)                 Rica Records

## 2019-07-17 NOTE — Lactation Note (Signed)
This note was copied from a baby's chart. Lactation Consultation Note Baby 26 hrs old. LC noted from charting baby hadn't been interested in BF during the day. LC f/u w/mom to see if LC could try to latch and stimulate baby to feed. Mom was holding sleeping baby. Mom stated he just BF well. Had had been sleepy all day. Mom had asked for formula because she felt like the baby needed more to eat since he hadn't eaten all day. Mom stated she hand expressed and spoon fed him a little bit of colostrum.  Encouraged mom to call for next feeding so LC/RN can see latch.   Patient Name: Sarah Villanueva [id number] Date: 07/17/2019 Reason for consult: Follow-up assessment;Primapara;Term   Maternal Data    Feeding Feeding Type: Breast Fed  LATCH Score                   Interventions    Lactation Tools Discussed/Used     Consult Status Consult Status: Follow-up Date: 07/18/19 Follow-up type: In-patient    Charyl Dancer 07/17/2019, 11:24 PM

## 2019-07-18 MED ORDER — IBUPROFEN 600 MG PO TABS: 600.0000 mg | ORAL_TABLET | Freq: Three times a day (TID) | ORAL | 0 refills | Status: DC | PRN

## 2019-07-18 MED ORDER — SENNOSIDES-DOCUSATE SODIUM 8.6-50 MG PO TABS: 2.0000 | ORAL_TABLET | ORAL | 0 refills | Status: DC

## 2019-07-18 MED ORDER — ACETAMINOPHEN 325 MG PO TABS: 650.0000 mg | ORAL_TABLET | Freq: Four times a day (QID) | ORAL | 0 refills | Status: DC | PRN

## 2019-07-18 NOTE — Lactation Note (Addendum)
This note was copied from a baby's chart. Lactation Consultation Note:  Mother is a P13 , infant is 67 hours old and is now at 4 % wt loss.  Mother was given [facility] brochure and basic teaching done.   Reviewed hand expression with mother. Observed large drops of colostrum. Mother was given a harmony hand pump with instructions. Mothers nipples are erect with compressible breast tissue. No observed trama of mothers nipples.   She is active with WIC . Mother has a DEBP sat up at the bedside.   Mother was observed with infant latched on at the left breast. Observed infant suckling with audible swallows. Infant sustained latch for 20 mins.  Infant was given 6 ml of ebm that was  At the bedside while breast feeding with a curved tip syringe.  Discussed treatment and prevention of engorgement.   Plan of Care : Breastfeed infant with feeding cues Supplement infant with ebm/ according to supplemental guidelines. Pump using a DEBP after each feeding for 15-20 mins.  Mother has a pump at home.   Mother to continue to cue base feed infant and feed at least 8-12 times or more in 24 hours and advised to allow for cluster feeding infant as needed.   Mother to continue to due STS. Mother is aware of available LC services at [facility] B[facility], OP Dept, and phone # for questions or concerns about breastfeeding.  Mother receptive to all teaching and plan of care.    Patient Name: Boy Charlen Bakula [id number] Date: 07/18/2019 Reason for consult: Follow-up assessment   Maternal Data    Feeding Feeding Type: Breast Fed  LATCH Score                   Interventions    Lactation Tools Discussed/Used     Consult Status      Michel Bickers 07/18/2019, 11:12 AM

## 2019-07-25 ENCOUNTER — Telehealth: Payer: Self-pay | Admitting: *Deleted

## 2019-07-25 NOTE — Telephone Encounter (Signed)
Left patient an urgent message to call and schedule 4 week Postpartum.

## 2019-07-25 NOTE — Telephone Encounter (Signed)
-----   Message from Joselyn Arrow, MD sent at 07/16/2019  9:16 PM EDT ----- Regarding: Post Partum Appt Please schedule this patient for Postpartum visit in: 4 weeks with the following provider: Any provider Virtual For C/S patients schedule nurse incision check in weeks 2 weeks: no Low risk pregnancy complicated by: none Delivery mode:  SVD Anticipated Birth Control:  none PP Procedures needed: None  Schedule Integrated BH visit: no

## 2019-08-17 ENCOUNTER — Telehealth (INDEPENDENT_AMBULATORY_CARE_PROVIDER_SITE_OTHER): Payer: Medicaid Other | Admitting: Obstetrics and Gynecology

## 2019-08-17 MED ORDER — NORETHINDRONE 0.35 MG PO TABS: 1.0000 | ORAL_TABLET | Freq: Every day | ORAL | 11 refills | Status: DC

## 2019-08-17 NOTE — Progress Notes (Deleted)
TELEHEALTH VIRTUAL POSTPARTUM VISIT ENCOUNTER NOTE  I connected with@ on 08/17/19 at  9:30 AM EDT by telephone at home and verified that I am speaking with the correct person using two identifiers.   I discussed the limitations, risks, security and privacy concerns of performing an evaluation and management service by telephone and the availability of in person appointments. I also discussed with the patient that there may be a patient responsible charge related to this service. The patient expressed understanding and agreed to proceed.  Appointment Date: 08/17/2019  OBGYN Clinic: [facility]   Chief Complaint:  Postpartum Visit  History of Present Illness: Sarah Villanueva is a 29 y.o. Caucasian G2P1011 (No LMP recorded.), seen for the above chief complaint. Her past medical history is significant for obesity, cystic fibrosis carrier, HSV   She is s/p normal spontaneous vaginal delivery on 4/10 at 40 weeks; she was discharged to home on 07/18/2019. Pregnancy complicated by None.  Baby is doing well.  Complains of None  Vaginal bleeding or discharge: No  Mode of feeding infant: Bottle and Breast Intercourse: No  Contraception: no method PP depression s/s: No .  Any bowel or bladder issues: No  Pap smear: no abnormalities (date: 04/19/2018)  Review of Systems: Positive for None. Her 12 point review of systems is negative or as noted in the History of Present Illness.  Patient Active Problem List   Diagnosis Date Noted  . Postpartum exam 08/17/2019  . Post-dates pregnancy 07/16/2019  . Recurrent genital HSV (herpes simplex virus) infection 05/31/2019  . Large for gestational age fetus in third trimester 05/31/2019  . Excess weight gain in pregnancy 04/26/2019  . Herpes simplex infection during pregnancy 03/07/2019  . Trichomonal vaginitis in pregnancy 03/07/2019  . Cystic fibrosis carrier 02/22/2019  . Not immune to rubella 01/05/2019  . Supervision of normal pregnancy 12/16/2018    . Obesity in pregnancy 12/16/2018    Medications Sarah Villanueva had no medications administered during this visit. Current Outpatient Medications  Medication Sig Dispense Refill  . acetaminophen (TYLENOL) 325 MG tablet Take 2 tablets (650 mg total) by mouth every 6 (six) hours as needed (for pain scale < 4). 30 tablet 0  . ibuprofen (ADVIL) 600 MG tablet Take 1 tablet (600 mg total) by mouth every 8 (eight) hours as needed for mild pain. 30 tablet 0  . Prenatal Vit-Fe Fumarate-FA (PRENATAL MULTIVITAMIN) TABS tablet Take 1 tablet by mouth daily at 12 noon.    . senna-docusate (SENOKOT-S) 8.6-50 MG tablet Take 2 tablets by mouth daily. 30 tablet 0  . norethindrone (MICRONOR) 0.35 MG tablet Take 1 tablet (0.35 mg total) by mouth daily. 1 Package 11   No current facility-administered medications for this visit.    Allergies Patient has no known allergies.  Physical Exam:  General:  Alert, oriented and cooperative.   Mental Status: Normal mood and affect perceived. Normal judgment and thought content.  Rest of physical exam deferred due to type of encounter  PP Depression Screening:   Edinburgh Postnatal Depression Scale - 08/17/19 0937      Edinburgh Postnatal Depression Scale:  In the Past 7 Days   I have been able to laugh and see the funny side of things.  0    I have looked forward with enjoyment to things.  0    I have blamed myself unnecessarily when things went wrong.  0    I have been anxious or worried for no good reason.  0  I have felt scared or panicky for no good reason.  0    Things have been getting on top of me.  0    I have been so unhappy that I have had difficulty sleeping.  0    I have felt sad or miserable.  1    I have been so unhappy that I have been crying.  0    The thought of harming myself has occurred to me.  0    Edinburgh Postnatal Depression Scale Total  1       Assessment:Patient is a 29 y.o. G2P1011 who is 4 weeks postpartum from a normal  spontaneous vaginal delivery.  She is doing well.   Plan:  Rx: progesterone pills: Rx sent   RTC: Please log BP into babyscripts today. No HA, scotoma or pain.   I discussed the assessment and treatment plan with the patient. The patient was provided an opportunity to ask questions and all were answered. The patient agreed with the plan and demonstrated an understanding of the instructions.   The patient was advised to call back or seek an in-person evaluation/go to the ED for any concerning postpartum symptoms.  I provided 9 minutes of non-face-to-face time during this encounter.   Noni Saupe, NP [facility], [place]

## 2019-08-17 NOTE — Progress Notes (Signed)
  TELEHEALTH VIRTUAL POSTPARTUM VISIT ENCOUNTER NOTE  I connected with@ on 08/17/19 at  9:30 AM EDT by telephone at home and verified that I am speaking with the correct person using two identifiers.   I discussed the limitations, risks, security and privacy concerns of performing an evaluation and management service by telephone and the availability of in person appointments. I also discussed with the patient that there may be a patient responsible charge related to this service. The patient expressed understanding and agreed to proceed.  Appointment Date: 08/17/2019  OBGYN Clinic: [place]   Chief Complaint:  Postpartum Visit  History of Present Illness: Sarah Villanueva is a 29 y.o. Caucasian G2P1011 (No LMP recorded.), seen for the above chief complaint. Her past medical history is significant for obesity, cystic fibrosis carrier, HSV   She is s/p normal spontaneous vaginal delivery on 4/10 at 40 weeks; she was discharged to home on 07/18/2019. Pregnancy complicated by None.  Baby is doing well.  Complains of None  Vaginal bleeding or discharge: No  Mode of feeding infant: Bottle and Breast Intercourse: No  Contraception: no method PP depression s/s: No .  Any bowel or bladder issues: No  Pap smear: no abnormalities (date: 04/19/2018)  Review of Systems: Positive for None. Her 12 point review of systems is negative or as noted in the History of Present Illness.  Patient Active Problem List   Diagnosis Date Noted  . Postpartum exam 08/17/2019  . Post-dates pregnancy 07/16/2019  . Recurrent genital HSV (herpes simplex virus) infection 05/31/2019  . Large for gestational age fetus in third trimester 05/31/2019  . Excess weight gain in pregnancy 04/26/2019  . Herpes simplex infection during pregnancy 03/07/2019  . Trichomonal vaginitis in pregnancy 03/07/2019  . Cystic fibrosis carrier 02/22/2019  . Not immune to rubella 01/05/2019  . Supervision of normal pregnancy 12/16/2018    . Obesity in pregnancy 12/16/2018    Medications Winry Scroggins had no medications administered during this visit. Current Outpatient Medications  Medication Sig Dispense Refill  . acetaminophen (TYLENOL) 325 MG tablet Take 2 tablets (650 mg total) by mouth every 6 (six) hours as needed (for pain scale < 4). 30 tablet 0  . ibuprofen (ADVIL) 600 MG tablet Take 1 tablet (600 mg total) by mouth every 8 (eight) hours as needed for mild pain. 30 tablet 0  . Prenatal Vit-Fe Fumarate-FA (PRENATAL MULTIVITAMIN) TABS tablet Take 1 tablet by mouth daily at 12 noon.    . senna-docusate (SENOKOT-S) 8.6-50 MG tablet Take 2 tablets by mouth daily. 30 tablet 0  . norethindrone (MICRONOR) 0.35 MG tablet Take 1 tablet (0.35 mg total) by mouth daily. 1 Package 11   No current facility-administered medications for this visit.    Allergies Patient has no known allergies.  Physical Exam:  General:  Alert, oriented and cooperative.   Mental Status: Normal mood and affect perceived. Normal judgment and thought content.  Rest of physical exam deferred due to type of encounter  PP Depression Screening:   Edinburgh Postnatal Depression Scale - 08/17/19 0937      Edinburgh Postnatal Depression Scale:  In the Past 7 Days   I have been able to laugh and see the funny side of things.  0    I have looked forward with enjoyment to things.  0    I have blamed myself unnecessarily when things went wrong.  0    I have been anxious or worried for no good reason.  0      I have felt scared or panicky for no good reason.  0    Things have been getting on top of me.  0    I have been so unhappy that I have had difficulty sleeping.  0    I have felt sad or miserable.  1    I have been so unhappy that I have been crying.  0    The thought of harming myself has occurred to me.  0    Edinburgh Postnatal Depression Scale Total  1       Assessment:Patient is a 29 y.o. G2P1011 who is 4 weeks postpartum from a normal  spontaneous vaginal delivery.  She is doing well.   Plan:  Rx: progesterone pills: Rx sent   RTC: Please log BP into babyscripts today. No HA, scotoma or pain.   I discussed the assessment and treatment plan with the patient. The patient was provided an opportunity to ask questions and all were answered. The patient agreed with the plan and demonstrated an understanding of the instructions.   The patient was advised to call back or seek an in-person evaluation/go to the ED for any concerning postpartum symptoms.  I provided 9 minutes of non-face-to-face time during this encounter.   Arlyss Weathersby, NP [facility]   

## 2019-08-17 NOTE — Progress Notes (Signed)
ERROR

## 2019-08-19 ENCOUNTER — Telehealth (INDEPENDENT_AMBULATORY_CARE_PROVIDER_SITE_OTHER): Payer: Medicaid Other | Admitting: Obstetrics and Gynecology

## 2019-12-14 ENCOUNTER — Other Ambulatory Visit: Payer: Self-pay

## 2019-12-14 ENCOUNTER — Encounter: Payer: Self-pay | Admitting: Obstetrics and Gynecology

## 2019-12-14 ENCOUNTER — Other Ambulatory Visit (HOSPITAL_COMMUNITY)
Admission: RE | Admit: 2019-12-14 | Discharge: 2019-12-14 | Disposition: A | Discharge status: Home / Self Care | Payer: Medicaid Other | Source: Ambulatory Visit | Attending: Obstetrics and Gynecology | Admitting: Obstetrics and Gynecology

## 2019-12-14 ENCOUNTER — Ambulatory Visit: Payer: Medicaid Other | Admitting: Obstetrics and Gynecology

## 2019-12-14 VITALS — BP 107/67 | HR 68 | Ht 71.0 in | Wt 244.0 lb

## 2019-12-14 DIAGNOSIS — R102 Pelvic and perineal pain: Secondary | ICD-10-CM | POA: Insufficient documentation

## 2019-12-14 LAB — POCT URINALYSIS DIPSTICK
Bilirubin, UA: NEGATIVE
Blood, UA: NEGATIVE
Glucose, UA: NEGATIVE
Ketones, UA: NEGATIVE
Leukocytes, UA: NEGATIVE
Nitrite, UA: NEGATIVE
Protein, UA: NEGATIVE
Spec Grav, UA: 1.015 (ref 1.010–1.025)
Urobilinogen, UA: 0.2 E.U./dL
pH, UA: 5 (ref 5.0–8.0)

## 2019-12-14 NOTE — Progress Notes (Signed)
GYNECOLOGY ENCOUNTER NOTE  History:     Sarah Villanueva is a 29 y.o. G77P1011 female here for painful intercourse x 1 week.  Reports this is a new onset. Reports pain is worse with position change especially if partner is behind her. Penedtraion makes the pain worse. Reports period was 2 weeks ago was heavier than normal.  Reports having spotting in-between. Having a mentrual cycle every month. Periods are not painful.  Baby is 32 months old; she is not breastfeeding.   Obstetric History OB History  Gravida Para Term Preterm AB Living  2 1 1   1 1   SAB TAB Ectopic Multiple Live Births    1   0 1    # Outcome Date GA Lbr Len/2nd Weight Sex Delivery Anes PTL Lv  2 Term 07/16/19 [redacted]w[redacted]d 00:20 / 00:10 9 lb 9.3 oz (4.346 kg) M Vag-Spont EPI  LIV  1 TAB 2016            Past Medical History:  Diagnosis Date  . Cystic fibrosis carrier   . 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 Obesity   . Trichomonal vaginitis during pregnancy     History reviewed. No pertinent surgical history.  Current Outpatient Medications on File Prior to Visit  Medication Sig Dispense Refill  . acetaminophen (TYLENOL) 325 MG tablet Take 2 tablets (650 mg total) by mouth every 6 (six) hours as needed (for pain scale < 4). (Patient not taking: Reported on 12/14/2019) 30 tablet 0  . ibuprofen (ADVIL) 600 MG tablet Take 1 tablet (600 mg total) by mouth every 8 (eight) hours as needed for mild pain. (Patient not taking: Reported on 12/14/2019) 30 tablet 0  . norethindrone (MICRONOR) 0.35 MG tablet Take 1 tablet (0.35 mg total) by mouth daily. (Patient not taking: Reported on 12/14/2019) 1 Package 11  . Prenatal Vit-Fe Fumarate-FA (PRENATAL MULTIVITAMIN) TABS tablet Take 1 tablet by mouth daily at 12 noon. (Patient not taking: Reported on 12/14/2019)    . senna-docusate (SENOKOT-S) 8.6-50 MG tablet Take 2 tablets by mouth daily. (Patient not taking: Reported on 12/14/2019) 30 tablet 0   No current facility-administered medications on  file prior to visit.    No Known Allergies  Social History:  reports that she has never smoked. She has never used smokeless tobacco. She reports previous alcohol use. She reports that she does not use drugs.  Family History  Problem Relation Age of Onset  . Hypertension Paternal Grandfather   . Diabetes Paternal Grandfather   . Hypertension Paternal Grandmother   . Diabetes Paternal Grandmother   . Diabetes Father   . Hypertension Father   . Cervical cancer Mother     The following portions of the patient's history were reviewed and updated as appropriate: allergies, current medications, past family history, past medical history, past social history, past surgical history and problem list.  Review of Systems Pertinent items noted in HPI and remainder of comprehensive ROS otherwise negative.  Physical Exam:  BP 107/67   Pulse 68   Ht 5\' 11"  (1.803 m)   Wt 244 lb (110.7 kg)   LMP 11/30/2019 (Approximate)   Breastfeeding No   BMI 34.03 kg/m  CONSTITUTIONAL: Well-developed, well-nourished female in no acute distress.  HENT:  Normocephalic, atraumatic, External right and left ear normal. Oropharynx is clear and moist MUSCULOSKELETAL: Normal range of motion. No tenderness.  No cyanosis, clubbing, or edema.  2+ distal pulses. NEUROLOGIC: Alert and oriented to person, place,  and time.  PSYCHIATRIC: Normal mood and affect.  ABDOMEN: Soft, no distention noted.  No tenderness, rebound or guarding.  PELVIC: Normal appearing external genitalia and urethral meatus. Normal uterine size, no other palpable masses, no uterine or adnexal tenderness. Mild pain at suprapubic area. Performed in the presence of a chaperone.   Assessment and Plan:   1. Pelvic pain  - Cervicovaginal ancillary only( [place]) - POCT Urinalysis Dipstick - Urine Culture - may be hormonal d/t delivery. Would consider Pelvic US if pain continues.     Duane Lope, NP 12/16/2019 8:11 AM [facility]

## 2019-12-14 NOTE — Progress Notes (Signed)
Pt c/o pelvic pain and heavy periods Pt denies UTI symptoms

## 2019-12-15 LAB — CERVICOVAGINAL ANCILLARY ONLY
Bacterial Vaginitis (gardnerella): NEGATIVE
Candida Glabrata: NEGATIVE
Candida Vaginitis: POSITIVE — AB
Chlamydia: NEGATIVE
Comment: NEGATIVE
Comment: NEGATIVE
Comment: NEGATIVE
Comment: NEGATIVE
Comment: NEGATIVE
Comment: NORMAL
Neisseria Gonorrhea: NEGATIVE
Trichomonas: NEGATIVE

## 2019-12-15 LAB — URINE CULTURE
MICRO NUMBER:: 10923399
SPECIMEN QUALITY:: ADEQUATE

## 2019-12-21 ENCOUNTER — Other Ambulatory Visit: Payer: Self-pay | Admitting: Advanced Practice Midwife

## 2019-12-21 DIAGNOSIS — N3000 Acute cystitis without hematuria: Secondary | ICD-10-CM

## 2019-12-21 DIAGNOSIS — A491 Streptococcal infection, unspecified site: Secondary | ICD-10-CM

## 2019-12-21 MED ORDER — AMOXICILLIN 500 MG PO CAPS: 500.0000 mg | ORAL_CAPSULE | Freq: Three times a day (TID) | ORAL | 0 refills | Status: DC

## 2019-12-21 NOTE — Progress Notes (Signed)
Pt sent MyChart message to ask about urine culture results and is still experiencing pain.  Rx for amoxicillin 500 mg TID x 7 days for GBS UTI in nonpregnant pt.  Pt to f/u in office if not improved in 3-4 days.

## 2019-12-22 ENCOUNTER — Encounter (HOSPITAL_COMMUNITY): Payer: Self-pay | Admitting: Emergency Medicine

## 2019-12-22 ENCOUNTER — Other Ambulatory Visit: Payer: Self-pay

## 2019-12-22 ENCOUNTER — Ambulatory Visit (HOSPITAL_COMMUNITY)
Admission: EM | Admit: 2019-12-22 | Discharge: 2019-12-22 | Disposition: A | Discharge status: Home / Self Care | Payer: Medicaid Other | Attending: Family Medicine | Admitting: Family Medicine

## 2019-12-22 DIAGNOSIS — G43909 Migraine, unspecified, not intractable, without status migrainosus: Secondary | ICD-10-CM | POA: Diagnosis not present

## 2019-12-22 DIAGNOSIS — T679XXA Effect of heat and light, unspecified, initial encounter: Secondary | ICD-10-CM | POA: Diagnosis not present

## 2019-12-22 DIAGNOSIS — Z3202 Encounter for pregnancy test, result negative: Secondary | ICD-10-CM | POA: Diagnosis not present

## 2019-12-22 LAB — BASIC METABOLIC PANEL
Anion gap: 10 (ref 5–15)
BUN: 12 mg/dL (ref 6–20)
CO2: 26 mmol/L (ref 22–32)
Calcium: 9.5 mg/dL (ref 8.9–10.3)
Chloride: 104 mmol/L (ref 98–111)
Creatinine, Ser: 0.74 mg/dL (ref 0.44–1.00)
GFR calc Af Amer: >60 mL/min (ref >60)
GFR calc non Af Amer: >60 mL/min (ref >60)
Glucose, Bld: 91 mg/dL (ref 70–99)
Potassium: 4 mmol/L (ref 3.5–5.1)
Sodium: 140 mmol/L (ref 135–145)

## 2019-12-22 LAB — CBC WITH DIFFERENTIAL/PLATELET
Abs Immature Granulocytes: 0.04 10*3/uL (ref 0.00–0.07)
Basophils Absolute: 0 10*3/uL (ref 0.0–0.1)
Basophils Relative: 0 %
Eosinophils Absolute: 0.1 10*3/uL (ref 0.0–0.5)
Eosinophils Relative: 1 %
HCT: 41.9 % (ref 36.0–46.0)
Hemoglobin: 12.5 g/dL (ref 12.0–15.0)
Immature Granulocytes: 0 %
Lymphocytes Relative: 24 %
Lymphs Abs: 2.7 10*3/uL (ref 0.7–4.0)
MCH: 24.1 pg — ABNORMAL LOW (ref 26.0–34.0)
MCHC: 29.8 g/dL — ABNORMAL LOW (ref 30.0–36.0)
MCV: 80.7 fL (ref 80.0–100.0)
Monocytes Absolute: 0.5 10*3/uL (ref 0.1–1.0)
Monocytes Relative: 5 %
Neutro Abs: 7.7 10*3/uL (ref 1.7–7.7)
Neutrophils Relative %: 70 %
Platelets: 405 10*3/uL — ABNORMAL HIGH (ref 150–400)
RBC: 5.19 MIL/uL — ABNORMAL HIGH (ref 3.87–5.11)
RDW: 14.6 % (ref 11.5–15.5)
WBC: 11.1 10*3/uL — ABNORMAL HIGH (ref 4.0–10.5)
nRBC: 0 % (ref 0.0–0.2)

## 2019-12-22 LAB — POCT URINALYSIS DIPSTICK, ED / UC
Bilirubin Urine: NEGATIVE
Glucose, UA: NEGATIVE mg/dL
Hgb urine dipstick: NEGATIVE
Ketones, ur: NEGATIVE mg/dL
Nitrite: NEGATIVE
Protein, ur: NEGATIVE mg/dL
Specific Gravity, Urine: 1.025 (ref 1.005–1.030)
Urobilinogen, UA: 0.2 mg/dL (ref 0.0–1.0)
pH: 6 (ref 5.0–8.0)

## 2019-12-22 LAB — POC URINE PREG, ED: Preg Test, Ur: NEGATIVE

## 2019-12-22 LAB — TSH: TSH: 5.796 u[IU]/mL — ABNORMAL HIGH (ref 0.350–4.500)

## 2019-12-22 MED ORDER — RIZATRIPTAN BENZOATE 10 MG PO TABS: 10.0000 mg | ORAL_TABLET | ORAL | 0 refills | Status: DC | PRN

## 2019-12-22 NOTE — ED Provider Notes (Signed)
[facility]    CSN: [id number] Arrival date & time: 12/22/19  1112      History   Chief Complaint Chief Complaint  Patient presents with  . Heat Exposure    HPI Sarah Villanueva is a 29 y.o. female.   Migraine, nausea for the past week and then yesterday had some sharp pains in the back of her head radiating down toward her left shoulder for a few seconds and during this could not concentrate for this period of time. Today feeling back to baseline, denies any fever, chills, fatigue, syncope, N/V/D. Has not taken anything for any of these sxs. Notes past hx of severe migraines years ago that would cause frequent issues.      Past Medical History:  Diagnosis Date  . Cystic fibrosis carrier   . 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 Obesity   . Trichomonal vaginitis during pregnancy     Patient Active Problem List   Diagnosis Date Noted  . Postpartum exam 08/17/2019  . Post-dates pregnancy 07/16/2019  . Recurrent genital HSV (herpes simplex virus) infection 05/31/2019  . Large for gestational age fetus in third trimester 05/31/2019  . Excess weight gain in pregnancy 04/26/2019  . Herpes simplex infection during pregnancy 03/07/2019  . Trichomonal vaginitis in pregnancy 03/07/2019  . Cystic fibrosis carrier 02/22/2019  . Not immune to rubella 01/05/2019  . Supervision of normal pregnancy 12/16/2018  . Obesity in pregnancy 12/16/2018    History reviewed. No pertinent surgical history.  OB History    Gravida  2   Para  1   Term  1   Preterm      AB  1   Living  1     SAB      TAB  1   Ectopic      Multiple  0   Live Births  1            Home Medications    Prior to Admission medications   Medication Sig Start Date End Date Taking? Authorizing Provider  acetaminophen (TYLENOL) 325 MG tablet Take 2 tablets (650 mg total) by mouth every 6 (six) hours as needed (for pain scale < 4). Patient not taking: Reported on 12/14/2019 07/18/19   Joselyn Arrow, MD  amoxicillin (AMOXIL) 500 MG capsule Take 1 capsule (500 mg total) by mouth 3 (three) times daily. 12/21/19   Leftwich-Kirby, Wilmer Floor, CNM  ibuprofen (ADVIL) 600 MG tablet Take 1 tablet (600 mg total) by mouth every 8 (eight) hours as needed for mild pain. Patient not taking: Reported on 12/14/2019 07/18/19   Joselyn Arrow, MD  norethindrone (MICRONOR) 0.35 MG tablet Take 1 tablet (0.35 mg total) by mouth daily. Patient not taking: Reported on 12/14/2019 08/17/19   Rasch, Victorino Dike I, NP  Prenatal Vit-Fe Fumarate-FA (PRENATAL MULTIVITAMIN) TABS tablet Take 1 tablet by mouth daily at 12 noon. Patient not taking: Reported on 12/14/2019    [provider]  rizatriptan (MAXALT) 10 MG tablet Take 1 tablet (10 mg total) by mouth as needed for migraine. May repeat in 2 hours if needed. Max of 2 tabs daily 12/22/19   Particia Nearing, PA-C  senna-docusate (SENOKOT-S) 8.6-50 MG tablet Take 2 tablets by mouth daily. Patient not taking: Reported on 12/14/2019 07/19/19   Joselyn Arrow, MD    Family History Family History  Problem Relation Age of Onset  . Hypertension Paternal Grandfather   . Diabetes Paternal Grandfather   .  Hypertension Paternal Grandmother   . Diabetes Paternal Grandmother   . Diabetes Father   . Hypertension Father   . Cervical cancer Mother     Social History Social History   Tobacco Use  . Smoking status: Never Smoker  . Smokeless tobacco: Never Used  Vaping Use  . Vaping Use: Never used  Substance Use Topics  . Alcohol use: Not Currently    Comment: occ  . Drug use: Never     Allergies   Patient has no known allergies.   Review of Systems Review of Systems PER HPI    Physical Exam Triage Vital Signs ED Triage Vitals  Enc Vitals Group     BP 12/22/19 1222 117/72     Pulse Rate 12/22/19 1222 66     Resp 12/22/19 1222 14     Temp 12/22/19 1222 98.6 F (37 C)     Temp Source 12/22/19 1222 Oral     SpO2 12/22/19 1222 99 %     Weight  --      Height --      Head Circumference --      Peak Flow --      Pain Score 12/22/19 1219 0     Pain Loc --      Pain Edu? --      Excl. in GC? --    No data found.  Updated Vital Signs BP 117/72 (BP Location: Left Arm)   Pulse 66   Temp 98.6 F (37 C) (Oral)   Resp 14   LMP 11/30/2019 (Approximate)   SpO2 99%   Visual Acuity Right Eye Distance:   Left Eye Distance:   Bilateral Distance:    Right Eye Near:   Left Eye Near:    Bilateral Near:     Physical Exam Vitals and nursing note reviewed.  Constitutional:      Appearance: Normal appearance. She is not ill-appearing.  HENT:     Head: Atraumatic.     Right Ear: Tympanic membrane normal.     Left Ear: Tympanic membrane normal.     Nose: Nose normal. No congestion.     Mouth/Throat:     Mouth: Mucous membranes are moist.     Pharynx: Oropharynx is clear.  Eyes:     Extraocular Movements: Extraocular movements intact.     Conjunctiva/sclera: Conjunctivae normal.  Cardiovascular:     Rate and Rhythm: Normal rate and regular rhythm.     Heart sounds: Normal heart sounds.  Pulmonary:     Effort: Pulmonary effort is normal.     Breath sounds: Normal breath sounds.  Abdominal:     General: Bowel sounds are normal. There is no distension.     Palpations: Abdomen is soft.     Tenderness: There is no abdominal tenderness. There is no guarding.  Musculoskeletal:        General: Normal range of motion.     Cervical back: Normal range of motion and neck supple.  Skin:    General: Skin is warm and dry.  Neurological:     General: No focal deficit present.     Mental Status: She is alert and oriented to person, place, and time.     Motor: No weakness.     Gait: Gait normal.  Psychiatric:        Mood and Affect: Mood normal.        Thought Content: Thought content normal.        Judgment: Judgment  normal.    UC Treatments / Results  Labs (all labs ordered are listed, but only abnormal results are  displayed) Labs Reviewed  CBC WITH DIFFERENTIAL/PLATELET - Abnormal; Notable for the following components:      Result Value   WBC 11.1 (*)    RBC 5.19 (*)    MCH 24.1 (*)    MCHC 29.8 (*)    Platelets 405 (*)    All other components within normal limits  TSH - Abnormal; Notable for the following components:   TSH 5.796 (*)    All other components within normal limits  POCT URINALYSIS DIPSTICK, ED / UC - Abnormal; Notable for the following components:   Leukocytes,Ua TRACE (*)    All other components within normal limits  BASIC METABOLIC PANEL  POC URINE PREG, ED    EKG   Radiology No results found.  Procedures Procedures (including critical care time)  Medications Ordered in UC Medications - No data to display  Initial Impression / Assessment and Plan / UC Course  I have reviewed the triage vital signs and the nursing notes.  Pertinent labs & imaging results that were available during my care of the patient were reviewed by me and considered in my medical decision making (see chart for details).     Suspect her sxs related to uncontrolled migraines, though cannot rule out some dehydration yesterday during acute event. Appears well and back to baseline health today, vitals and exam reassuring. Will rx maxalt for prn use for migraine sxs, work on hydration and rest. F/u if sxs worsening/recurring  Final Clinical Impressions(s) / UC Diagnoses   Final diagnoses:  Migraine without status migrainosus, not intractable, unspecified migraine type   Discharge Instructions   None    ED Prescriptions    Medication Sig Dispense Auth. Provider   rizatriptan (MAXALT) 10 MG tablet Take 1 tablet (10 mg total) by mouth as needed for migraine. May repeat in 2 hours if needed. Max of 2 tabs daily 10 tablet Particia Nearing, [place]     PDMP not reviewed this encounter.   Particia Nearing, [place] 12/22/19 1550

## 2019-12-22 NOTE — ED Triage Notes (Signed)
Pt states she feels like she had a possible heat stroke yesterday. 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 She states she then felt tingles across her head from ear to ear. She was nauseous after for 2 hours. She states she had mental confusion and pulled over and did not know where she was for about 15 minutes. She states her manager asked her to come in today. She reports she has had a headache x 1 week and she has had loss of grip in the right hand one week before symptoms began.

## 2021-04-04 ENCOUNTER — Encounter: Payer: Medicaid Other | Admitting: Obstetrics and Gynecology

## 2021-04-27 ENCOUNTER — Encounter (HOSPITAL_COMMUNITY): Payer: Self-pay

## 2021-04-27 ENCOUNTER — Emergency Department (HOSPITAL_COMMUNITY)
Admission: EM | Admit: 2021-04-27 | Discharge: 2021-04-27 | Disposition: A | Discharge status: Home / Self Care | Payer: Medicaid Other | Attending: Emergency Medicine | Admitting: Emergency Medicine

## 2021-04-27 ENCOUNTER — Other Ambulatory Visit: Payer: Self-pay

## 2021-04-27 ENCOUNTER — Emergency Department (HOSPITAL_COMMUNITY): Payer: Medicaid Other

## 2021-04-27 DIAGNOSIS — R109 Unspecified abdominal pain: Secondary | ICD-10-CM | POA: Insufficient documentation

## 2021-04-27 DIAGNOSIS — R197 Diarrhea, unspecified: Secondary | ICD-10-CM | POA: Insufficient documentation

## 2021-04-27 DIAGNOSIS — N9489 Other specified conditions associated with female genital organs and menstrual cycle: Secondary | ICD-10-CM | POA: Insufficient documentation

## 2021-04-27 DIAGNOSIS — R112 Nausea with vomiting, unspecified: Secondary | ICD-10-CM | POA: Insufficient documentation

## 2021-04-27 DIAGNOSIS — R1110 Vomiting, unspecified: Secondary | ICD-10-CM | POA: Diagnosis not present

## 2021-04-27 LAB — COMPREHENSIVE METABOLIC PANEL
ALT: 22 U/L (ref 0–44)
AST: 22 U/L (ref 15–41)
Albumin: 3.9 g/dL (ref 3.5–5.0)
Alkaline Phosphatase: 57 U/L (ref 38–126)
Anion gap: 10 (ref 5–15)
BUN: 10 mg/dL (ref 6–20)
CO2: 24 mmol/L (ref 22–32)
Calcium: 9.2 mg/dL (ref 8.9–10.3)
Chloride: 105 mmol/L (ref 98–111)
Creatinine, Ser: 0.91 mg/dL (ref 0.44–1.00)
GFR, Estimated: >60 mL/min (ref >60)
Glucose, Bld: 96 mg/dL (ref 70–99)
Potassium: 4 mmol/L (ref 3.5–5.1)
Sodium: 139 mmol/L (ref 135–145)
Total Bilirubin: 0.6 mg/dL (ref 0.3–1.2)
Total Protein: 7.8 g/dL (ref 6.5–8.1)

## 2021-04-27 LAB — URINALYSIS, ROUTINE W REFLEX MICROSCOPIC
Bilirubin Urine: NEGATIVE
Glucose, UA: NEGATIVE mg/dL
Hgb urine dipstick: NEGATIVE
Ketones, ur: 5 mg/dL — AB
Leukocytes,Ua: NEGATIVE
Nitrite: NEGATIVE
Protein, ur: NEGATIVE mg/dL
Specific Gravity, Urine: 1.026 (ref 1.005–1.030)
pH: 5 (ref 5.0–8.0)

## 2021-04-27 LAB — CBC
HCT: 40.5 % (ref 36.0–46.0)
Hemoglobin: 12.7 g/dL (ref 12.0–15.0)
MCH: 26.6 pg (ref 26.0–34.0)
MCHC: 31.4 g/dL (ref 30.0–36.0)
MCV: 84.7 fL (ref 80.0–100.0)
Platelets: 350 10*3/uL (ref 150–400)
RBC: 4.78 MIL/uL (ref 3.87–5.11)
RDW: 12.9 % (ref 11.5–15.5)
WBC: 12 10*3/uL — ABNORMAL HIGH (ref 4.0–10.5)
nRBC: 0 % (ref 0.0–0.2)

## 2021-04-27 LAB — I-STAT BETA HCG BLOOD, ED (MC, WL, AP ONLY): I-stat hCG, quantitative: <5 m[IU]/mL (ref <5)

## 2021-04-27 LAB — LIPASE, BLOOD: Lipase: 26 U/L (ref 11–51)

## 2021-04-27 MED ORDER — IOHEXOL 350 MG/ML SOLN
100.0000 mL | Freq: Once | INTRAVENOUS | Status: AC | PRN
  Administered 2021-04-27: 100 mL via INTRAVENOUS

## 2021-04-27 MED ORDER — SODIUM CHLORIDE 0.9 % IV BOLUS
1000.0000 mL | Freq: Once | INTRAVENOUS | Status: AC
  Administered 2021-04-27: 1000 mL via INTRAVENOUS

## 2021-04-27 MED ORDER — ONDANSETRON HCL 4 MG/2ML IJ SOLN
4.0000 mg | Freq: Once | INTRAMUSCULAR | Status: AC
  Administered 2021-04-27: 4 mg via INTRAVENOUS
  Filled 2021-04-27: qty 2

## 2021-04-27 MED ORDER — ONDANSETRON HCL 8 MG PO TABS: 8.0000 mg | ORAL_TABLET | Freq: Three times a day (TID) | ORAL | 0 refills | Status: DC | PRN

## 2021-04-27 NOTE — Discharge Instructions (Signed)
Start with a clear liquid diet and gradually advance to regular food as your vomiting improves.  For the diarrhea, use Imodium or Kaopectate.  Call the GI doctor for a follow-up appointment to be seen if you have ongoing symptoms.

## 2021-04-27 NOTE — ED Triage Notes (Signed)
Pt arrived via POV for c/o N/V/D, abdominal painx3 days. Pt states diarrhea started Thurs. Pt states had waves a cramping around umbilical area of abdomenx12 hrs.

## 2021-04-27 NOTE — ED Notes (Signed)
Pt ambulated to bathroom 

## 2021-04-27 NOTE — ED Notes (Signed)
Patient transported to CT 

## 2021-04-27 NOTE — ED Notes (Signed)
RN reviewed discharge instructions w/ pt. Follow up, symptom management and prescriptions reviewed, pt had no further questions.

## 2021-04-27 NOTE — ED Provider Notes (Signed)
[facility] EMERGENCY DEPARTMENT Provider Note   CSN: [id number] Arrival date & time: 04/27/21  1611     History  Chief Complaint  Patient presents with   Abdominal Pain    Sarah Villanueva is a 31 y.o. female.  HPI She presents for evaluation of nausea, vomiting and diarrhea with abdominal pain for 3 days.  The pain in her abdomen is "like a cramp."  She denies fever.  She has not seen any blood in her emesis or stool.  The stool is yellow and "like water.  She has had numerous bowel movements today.  She states that when she tries to eat she has exacerbation of her abdominal pain.  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  She does not have chronic intestinal disorders.    Home Medications Prior to Admission medications   Medication Sig Start Date End Date Taking? Authorizing Provider  ondansetron (ZOFRAN) 8 MG tablet Take 1 tablet (8 mg total) by mouth every 8 (eight) hours as needed for nausea or vomiting. 04/27/21  Yes Daleen Bo, MD  acetaminophen (TYLENOL) 325 MG tablet Take 2 tablets (650 mg total) by mouth every 6 (six) hours as needed (for pain scale < 4). Patient not taking: Reported on 12/14/2019 07/18/19   Chauncey Mann, MD  amoxicillin (AMOXIL) 500 MG capsule Take 1 capsule (500 mg total) by mouth 3 (three) times daily. Patient not taking: Reported on 04/27/2021 12/21/19   Fatima Blank A, CNM  ibuprofen (ADVIL) 600 MG tablet Take 1 tablet (600 mg total) by mouth every 8 (eight) hours as needed for mild pain. Patient not taking: Reported on 12/14/2019 07/18/19   Chauncey Mann, MD  norethindrone (MICRONOR) 0.35 MG tablet Take 1 tablet (0.35 mg total) by mouth daily. Patient not taking: Reported on 12/14/2019 08/17/19   Rasch, Anderson Malta I, NP  rizatriptan (MAXALT) 10 MG tablet Take 1 tablet (10 mg total) by mouth as needed for migraine. May repeat in 2 hours if  needed. Max of 2 tabs daily Patient not taking: Reported on 04/27/2021 12/22/19   Volney American, PA-C  senna-docusate (SENOKOT-S) 8.6-50 MG tablet Take 2 tablets by mouth daily. Patient not taking: Reported on 12/14/2019 07/19/19   Chauncey Mann, MD      Allergies    Patient has no known allergies.    Review of Systems   Review of Systems  All other systems reviewed and are negative.  Physical Exam Updated Vital Signs BP 108/68    Pulse 85    Temp 98.5 F (36.9 C) (Oral)    Resp 18    Ht 5\' 11"  (1.803 m)    Wt 110.7 kg    SpO2 99%    BMI 34.04 kg/m  Physical Exam Vitals and nursing note reviewed.  Constitutional:      General: She is not in acute distress.    Appearance: She is well-developed. She is not ill-appearing, toxic-appearing or diaphoretic.  HENT:     Head: Normocephalic and atraumatic.     Right Ear: External ear normal.     Left Ear: External ear normal.  Eyes:     Conjunctiva/sclera: Conjunctivae normal.     Pupils: Pupils are equal, round, and reactive to light.  Neck:     Trachea: Phonation normal.  Cardiovascular:     Rate and Rhythm: Normal rate and regular rhythm.  Heart sounds: Normal heart sounds.  Pulmonary:     Effort: Pulmonary effort is normal. No respiratory distress.     Breath sounds: Normal breath sounds. No stridor.  Abdominal:     General: There is no distension.     Palpations: Abdomen is soft.     Tenderness: There is no abdominal tenderness.  Musculoskeletal:        General: Normal range of motion.     Cervical back: Normal range of motion and neck supple.  Skin:    General: Skin is warm and dry.  Neurological:     Mental Status: She is alert and oriented to person, place, and time.     Cranial Nerves: No cranial nerve deficit.     Sensory: No sensory deficit.     Motor: No abnormal muscle tone.     Coordination: Coordination normal.  Psychiatric:        Mood and Affect: Mood normal.        Behavior: Behavior normal.         Thought Content: Thought content normal.        Judgment: Judgment normal.    ED Results / Procedures / Treatments   Labs (all labs ordered are listed, but only abnormal results are displayed) Labs Reviewed  CBC - Abnormal; Notable for the following components:      Result Value   WBC 12.0 (*)    All other components within normal limits  URINALYSIS, ROUTINE W REFLEX MICROSCOPIC - Abnormal; Notable for the following components:   APPearance HAZY (*)    Ketones, ur 5 (*)    All other components within normal limits  LIPASE, BLOOD  COMPREHENSIVE METABOLIC PANEL  I-STAT BETA HCG BLOOD, ED ([facility], [facility], [facility] ONLY)    EKG None  Radiology CT Abdomen Pelvis W Contrast  Result Date: 04/27/2021 CLINICAL DATA:  Diverticulitis, complication suspected. Nausea, vomiting, diarrhea. EXAM: CT ABDOMEN AND PELVIS WITH CONTRAST TECHNIQUE: Multidetector CT imaging of the abdomen and pelvis was performed using the standard protocol following bolus administration of intravenous contrast. RADIATION DOSE REDUCTION: This exam was performed according to the departmental dose-optimization program which includes automated exposure control, adjustment of the mA and/or kV according to patient size and/or use of iterative reconstruction technique. CONTRAST:  OMNIPAQUE IOHEXOL 350 MG/ML SOLN COMPARISON:  None. FINDINGS: Lower chest: No acute abnormality. Hepatobiliary: No focal liver abnormality is seen. No gallstones, gallbladder wall thickening, or biliary dilatation. Pancreas: Unremarkable. No pancreatic ductal dilatation or surrounding inflammatory changes. Spleen: Normal in size without focal abnormality. Adrenals/Urinary Tract: Adrenal glands are unremarkable. Kidneys are normal, without renal calculi, focal lesion, or hydronephrosis. Bladder is unremarkable. Stomach/Bowel: The stomach is within normal limits. No bowel obstruction, free air, or pneumatosis. The appendix is within normal limits. There is no  evidence of diverticulosis or diverticulitis. Nonspecific air-fluid levels are present throughout the colon and small bowel. There is loss of normal haustral pattern at the ascending colon. No focal bowel wall thickening. Vascular/Lymphatic: The vasculature is within normal limits. A nonspecific prominent lymph node is noted in the mesentery measuring up to 1 cm. Reproductive: The uterus is in-situ. A 1.7 cm cyst is present in the right ovary, likely dominant follicle. Other: No abdominal wall hernia or abnormality. No abdominopelvic ascites. Musculoskeletal: No acute or significant osseous findings. IMPRESSION: 1. No evidence of diverticulosis or diverticulitis. 2. Nonspecific air-fluid levels in the small bowel and colon, may be associated with enteritis/colitis in the appropriate clinical setting. 3. Loss  of normal haustral pattern in the descending colon is nonspecific and may be associated with inflammatory bowel disease. Electronically Signed   By: Brett Fairy M.D.   On: 04/27/2021 20:42    Procedures Procedures    Medications Ordered in ED Medications  sodium chloride 0.9 % bolus 1,000 mL (0 mLs Intravenous Stopped 04/27/21 2127)  ondansetron (ZOFRAN) injection 4 mg (4 mg Intravenous Given 04/27/21 2004)  iohexol (OMNIPAQUE) 350 MG/ML injection 100 mL (100 mLs Intravenous Contrast Given 04/27/21 2017)    ED Course/ Medical Decision Making/ A&P                           Medical Decision Making She is presenting for evaluation of illness for 4 days with nausea, vomiting and diarrhea.  There has been no bleeding seen.  She does not know of a sick exposure or toxic ingestion.  No prior similar problems.  No chronic intestinal disorders.  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    Details: CBC, metabolic panel, pregnancy test,  urinalysis-labs are reassuring without need for intervention. Radiology: ordered and independent interpretation performed.    Details: CT abdomen pelvis does not show acute problems including colitis, diverticulitis perforation, hydronephrosis or apparent liver disease.  Nonspecific: Findings, may need follow-up if she has persistent symptoms  Risk OTC drugs. Prescription drug management. Decision regarding hospitalization. Risk Details: Patient is nontoxic with GI symptoms, of nonspecific nature.  Differential diagnosis includes gastroenteritis, food poisoning, early onset inflammatory bowel disease.  She is nontoxic and does not require hospitalization.  She can be managed outpatient setting with Tylenol for pain, and Zofran for nausea and vomiting.  She is given a referral to GI for as needed follow-up.           Final Clinical Impression(s) / ED Diagnoses Final diagnoses:  Nausea vomiting and diarrhea    Rx / DC Orders ED Discharge Orders          Ordered    ondansetron (ZOFRAN) 8 MG tablet  Every 8 hours PRN        04/27/21 2124              Daleen Bo, MD 04/27/21 2243

## 2021-05-10 ENCOUNTER — Encounter: Payer: Self-pay | Admitting: Physician Assistant

## 2021-05-10 ENCOUNTER — Ambulatory Visit: Payer: Medicaid Other | Admitting: Physician Assistant

## 2021-05-10 ENCOUNTER — Other Ambulatory Visit (INDEPENDENT_AMBULATORY_CARE_PROVIDER_SITE_OTHER): Payer: Medicaid Other

## 2021-05-10 VITALS — BP 104/60 | HR 72 | Ht 71.0 in | Wt 250.8 lb

## 2021-05-10 DIAGNOSIS — R109 Unspecified abdominal pain: Secondary | ICD-10-CM

## 2021-05-10 DIAGNOSIS — K529 Noninfective gastroenteritis and colitis, unspecified: Secondary | ICD-10-CM

## 2021-05-10 LAB — SEDIMENTATION RATE: Sed Rate: 31 mm/hr — ABNORMAL HIGH (ref 0–20)

## 2021-05-10 LAB — C-REACTIVE PROTEIN: CRP: 1.7 mg/dL (ref 0.5–20.0)

## 2021-05-10 NOTE — Patient Instructions (Addendum)
If you are age 29 or younger, your body mass index should be between 19-25. Your Body mass index is 34.98 kg/m. If this is out of the aformentioned range listed, please consider follow up with your Primary Care Provider.  ________________________________________________________  The [place] [facility] providers would like to encourage you to use [facility] to communicate with providers for non-urgent requests or questions.  Due to long hold times on the telephone, sending your provider a message by [facility] may be a faster and more efficient way to get a response.  Please allow 48 business hours for a response.  Please remember that this is for non-urgent requests.  _______________________________________________________  Your provider has requested that you go to the basement level for lab work before leaving today. Press "B" on the elevator. The lab is located at the first door on the left as you exit the elevator.  Follow up pending the results of your labs.  Thank you for entrusting me with your care and choosing [facility].  Amy Esterwood, PA-C

## 2021-05-10 NOTE — Progress Notes (Signed)
Subjective:    Patient ID: Sarah Villanueva, female    DOB: 05-Feb-1991, 31 y.o.   MRN: [id number]  HPI Sarah Villanueva is a 31 year old white female, new to GI today, referred after recent ER visit on 04/27/2021.  She has not had any prior GI evaluation.  She is in generally good health. Patient had presented to the emergency room on 04/27/2021 with 3-day history of abrupt onset nausea vomiting diarrhea and abdominal cramping.  No associated fever, no melena or hematochezia.  No known infectious exposures. She had work-up with CT of the abdomen and pelvis which showed nonspecific air-fluid levels in the small bowel and colon, no focal bowel wall thickening and there was some possible loss of haustral pattern in the ascending colon.  Report lists as nonspecific but may be seen with IBD. Labs with WBC 12.0, hemoglobin 12.7, chemistries unremarkable  Patient says her symptoms lasted for about 10 days altogether and have since resolved.  She is back to feeling fairly normal, has no complaints of nausea, appetite is fine and she is eating without difficulty.  Bowel movements also back to normal she is still having some occasional mild abdominal cramping though much better than it had been. She mentions that she had a similar episode many months ago but only lasted for about 1 day and then resolved. She says for many years she will get an "acid the stomach" with an empty stomach, usually if she eats snacks between meals she does not have any problems, no ongoing heartburn or indigestion. Family history is negative for GI disease as far she is aware, specifically no family history of IBD or colon cancers. She is employed as a Programmer, systems.  Review of Systems Pertinent positive and negative review of systems were noted in the above HPI section.  All other review of systems was otherwise negative.   Outpatient Encounter Medications as of 05/10/2021  Medication Sig   ondansetron (ZOFRAN) 8 MG tablet  Take 1 tablet (8 mg total) by mouth every 8 (eight) hours as needed for nausea or vomiting.   [DISCONTINUED] acetaminophen (TYLENOL) 325 MG tablet Take 2 tablets (650 mg total) by mouth every 6 (six) hours as needed (for pain scale < 4).   [DISCONTINUED] ibuprofen (ADVIL) 600 MG tablet Take 1 tablet (600 mg total) by mouth every 8 (eight) hours as needed for mild pain.   [DISCONTINUED] norethindrone (MICRONOR) 0.35 MG tablet Take 1 tablet (0.35 mg total) by mouth daily.   [DISCONTINUED] senna-docusate (SENOKOT-S) 8.6-50 MG tablet Take 2 tablets by mouth daily.   [DISCONTINUED] amoxicillin (AMOXIL) 500 MG capsule Take 1 capsule (500 mg total) by mouth 3 (three) times daily.   [DISCONTINUED] rizatriptan (MAXALT) 10 MG tablet Take 1 tablet (10 mg total) by mouth as needed for migraine. May repeat in 2 hours if needed. Max of 2 tabs daily   No facility-administered encounter medications on file as of 05/10/2021.   No Known Allergies Patient Active Problem List   Diagnosis Date Noted   Postpartum exam 08/17/2019   Post-dates pregnancy 07/16/2019   Recurrent genital HSV (herpes simplex virus) infection 05/31/2019   Large for gestational age fetus in third trimester 05/31/2019   Excess weight gain in pregnancy 04/26/2019   Herpes simplex infection during pregnancy 03/07/2019   Trichomonal vaginitis in pregnancy 03/07/2019   Cystic fibrosis carrier 02/22/2019   Not immune to rubella 01/05/2019   Supervision of normal pregnancy 12/16/2018   Obesity in pregnancy 12/16/2018   Social History  Socioeconomic History   Marital status: Single    Spouse name: Not on file   Number of children: Not on file   Years of education: Not on file   Highest education level: Not on file  Occupational History   Not on file  Tobacco Use   Smoking status: Never   Smokeless tobacco: Never  Vaping Use   Vaping Use: Never used  Substance and Sexual Activity   Alcohol use: Not Currently    Comment: occ   Drug  use: Never   Sexual activity: Yes    Birth control/protection: None  Other Topics Concern   Not on file  Social History Narrative   Not on file   Social Determinants of Health   Financial Resource Strain: Not on file  Food Insecurity: Not on file  Transportation Needs: Not on file  Physical Activity: Not on file  Stress: Not on file  Social Connections: Not on file  Intimate Partner Violence: Not on file    Sarah Villanueva's family history includes Cervical cancer in her mother; Diabetes in her father, paternal grandfather, and paternal grandmother; Hypertension in her father, paternal grandfather, and paternal grandmother.      Objective:    Vitals:   05/10/21 1443  BP: 104/60  Pulse: 72    Physical Exam Well-developed well-nourished white female in no acute distress.  Height, Weight, 250 BMI 34.9  HEENT; nontraumatic normocephalic, EOMI, PE R LA, sclera anicteric. Oropharynx; not examined today Neck; supple, no JVD Cardiovascular; regular rate and rhythm with S1-S2, no murmur rub or gallop Pulmonary; Clear bilaterally Abdomen; soft, mild tenderness bilateral lower quadrants ,non distended, no palpable mass or hepatosplenomegaly, bowel sounds are active Rectal; not done today Skin; benign exam, no jaundice rash or appreciable lesions Extremities; no clubbing cyanosis or edema skin warm and dry Neuro/Psych; alert and oriented x4, grossly nonfocal mood and affect appropriate        Assessment & Plan:   #95 31 year old white female with an acute gastroenteritis with ER visit on 04/27/21 with 3 day history of nausea vomiting diarrhea and abdominal cramping.  Symptoms have since resolved but lasted in total about 10 days. Patient has had some mild intermittent cramping  CT of the abdomen and pelvis showed nonspecific air-fluid levels in the small bowel and colon, no focal bowel wall thickening, there was mention of loss of haustral pattern in the ascending colon  Mention  of loss of haustral pattern in the ascending colon on CT raises the question of underlying IBD. At this time patient does not have any chronic symptoms suggestive of IBD. She is still having some mild abdominal cramping, after her acute illness but otherwise feels well.  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  She will monitor her symptoms and knows to call back if she has any recurrence of significant cramping or diarrhea. We will consider colonoscopy if recurrence of symptoms and/or abnormal sed rate/CRP/lactoferrin. Patient will be established with Dr. Bryan Lemma.  Erian Rosengren S Natavia Sublette PA-C 05/10/2021   Cc: No ref. provider found

## 2021-05-13 ENCOUNTER — Other Ambulatory Visit: Payer: Medicaid Other

## 2021-05-13 DIAGNOSIS — R109 Unspecified abdominal pain: Secondary | ICD-10-CM | POA: Diagnosis not present

## 2021-05-13 DIAGNOSIS — K529 Noninfective gastroenteritis and colitis, unspecified: Secondary | ICD-10-CM

## 2021-05-13 NOTE — Progress Notes (Signed)
Agree with the assessment and plan as outlined by Amy Esterwood, PA-C.  Uvaldo Rybacki, DO, FACG  

## 2021-05-14 LAB — FECAL LACTOFERRIN, QUANT
Fecal Lactoferrin: NEGATIVE
MICRO NUMBER:: 12968213
SPECIMEN QUALITY:: ADEQUATE

## 2021-07-15 ENCOUNTER — Ambulatory Visit: Payer: Self-pay | Admitting: Physician Assistant

## 2021-10-06 ENCOUNTER — Emergency Department
Admission: EM | Admit: 2021-10-06 | Discharge: 2021-10-06 | Disposition: A | Discharge status: Home / Self Care | Payer: Self-pay | Attending: Emergency Medicine | Admitting: Emergency Medicine

## 2021-10-06 ENCOUNTER — Emergency Department: Payer: Self-pay

## 2021-10-06 ENCOUNTER — Encounter: Payer: Self-pay | Admitting: Emergency Medicine

## 2021-10-06 DIAGNOSIS — Y990 Civilian activity done for income or pay: Secondary | ICD-10-CM | POA: Insufficient documentation

## 2021-10-06 DIAGNOSIS — S8012XA Contusion of left lower leg, initial encounter: Secondary | ICD-10-CM | POA: Insufficient documentation

## 2021-10-06 DIAGNOSIS — W228XXA Striking against or struck by other objects, initial encounter: Secondary | ICD-10-CM | POA: Insufficient documentation

## 2021-10-06 NOTE — ED Triage Notes (Signed)
Pt reports slipping while getting into her [employer] truck on 6/16 and hitting the inside of her lower left leg on metal truck frame. Pt to ED tonight due to increased "shooting" pain at impact area. Bruising noted. No swelling or redness noted. Pt denies previous medical follow-up before today.

## 2021-10-06 NOTE — ED Provider Notes (Signed)
[facility] Emergency Department Provider Note     None    (approximate)   History   Leg Injury   HPI  Sarah Villanueva is a 31 y.o. female presents to the ED for evaluation of work-related injury.  Patient sustained an injury on June 16 while getting into her [employer] truck.  She apparently hit the inside of her left lower leg on the truck frame.  She presents to the ED tonight noting shooting pain at the area of impact.  Some bruising is also reported.  Patient has not been evaluated for this work-related complaint at onset or in the interim timeframe.     Physical Exam   Triage Vital Signs: ED Triage Vitals [10/06/21 2108]  Enc Vitals Group     BP 114/75     Pulse Rate 76     Resp 17     Temp (!) 97 F (36.1 C)     Temp src      SpO2 100 %     Weight      Height      Head Circumference      Peak Flow      Pain Score      Pain Loc      Pain Edu?      Excl. in GC?     Most recent vital signs: Vitals:   10/06/21 2108  BP: 114/75  Pulse: 76  Resp: 17  Temp: (!) 97 F (36.1 C)  SpO2: 100%    General Awake, no distress.  CV:  Good peripheral perfusion.  RESP:  Normal effort.  ABD:  No distention.  MSK:  Left lower leg without any obvious deformity, hematoma, or ecchymosis.  Small area of soft tissue swelling overlying the medial distal tibia.  No lymphangitis, ulceration, or skin breakdown appreciated.  Normal active range of motion of the knee and ankle.     ED Results / Procedures / Treatments   Labs (all labs ordered are listed, but only abnormal results are displayed) Labs Reviewed - No data to display   EKG   RADIOLOGY  I personally viewed and evaluated these images as part of my medical decision making, as well as reviewing the written report by the radiologist.  ED Provider Interpretation: no acute findings}  No results found.   PROCEDURES:  Critical Care performed: No  Procedures   MEDICATIONS ORDERED IN  ED: Medications - No data to display   IMPRESSION / MDM / ASSESSMENT AND PLAN / ED COURSE  I reviewed the triage vital signs and the nursing notes.                              Differential diagnosis includes, but is not limited to, leg contusion, leg fracture, hematoma, abrasion, contusion  Patient's presentation is most consistent with acute complicated illness / injury requiring diagnostic workup.  Patient's diagnosis is consistent with leg contusion. Patient will be discharged home with instructions for local management with warm compresses. Patient is to follow up with local urgent care or provider approved by her employer as needed or otherwise directed. Patient is given ED precautions to return to the ED for any worsening or new symptoms.     FINAL CLINICAL IMPRESSION(S) / ED DIAGNOSES   Final diagnoses:  Contusion of left lower leg, initial encounter  Hematoma of leg, left, initial encounter     Rx / DC  Orders   ED Discharge Orders     None        Note:  This document was prepared using Dragon voice recognition software and may include unintentional dictation errors.    Lissa Hoard, PA-C 10/11/21 2334    Phineas Semen, MD 10/13/21 [phone]

## 2021-10-06 NOTE — Discharge Instructions (Addendum)
X-rays negative for any acute fracture or dislocation to the left leg.  Your exam is consistent with the leg contusion and residual hematoma.  You may apply warm compress to help reduce symptoms.  Take OTC Tylenol Motrin as needed.  Follow with primary provider or your company's approved urgent care provider as needed.

## 2022-04-07 NOTE — L&D Delivery Note (Signed)
OB/[facility]N Faculty Practice Delivery Note  Sarah Villanueva is a 32 y.o. G3P1011 s/p SVD at [redacted]w[redacted]d. She was admitted for IOL for A2GDM.   ROM: 16h 13m with clear fluid GBS Status: Positive- received PCN Maximum Maternal Temperature: 98.7 Fahrenheit  Labor Progress: Pt had multiple variable decels, amnioinfusion started and IUPC replaced due to displacement, forebag noted and ruptured and patient was 4 cm, but variables continued despite decreasing pitocin to 20 units and position changes, and repeat SVE noted cervix complete/+2 and pt pushing.  Delivery Date/Time: 12/15/22 at 1331 Delivery: Called to room and patient was complete and pushing. Head delivered ROA. No nuchal cord present. Shoulder and body delivered in usual fashion. Infant with spontaneous cry, placed on mother's abdomen, dried and stimulated. Cord clamped x 2 after 1-minute delay, and cut by myself. Cord blood drawn. Placenta delivered spontaneously with gentle cord traction. Fundus firm with massage and Pitocin. Labia, perineum, vagina, and cervix were inspected, no lacerations appreciated, fundus was firm and excellent hemostasis achieved.   Placenta: complete, three vessel cord appreciated Complications: None Lacerations: none EBL: 175 mL Analgesia: epidural  Postpartum Planning [x]  message to sent to schedule follow-up   Infant: viable female female  APGARs 8, 9 at 1 and 5 minutes respectively  weight pending  Burley Saver, MD [facility] [employer]

## 2022-05-26 ENCOUNTER — Other Ambulatory Visit (HOSPITAL_COMMUNITY)
Admission: RE | Admit: 2022-05-26 | Discharge: 2022-05-26 | Disposition: A | Discharge status: Home / Self Care | Payer: Medicaid Other | Source: Ambulatory Visit | Attending: Obstetrics & Gynecology | Admitting: Obstetrics & Gynecology

## 2022-05-26 ENCOUNTER — Encounter: Payer: Self-pay | Admitting: Obstetrics & Gynecology

## 2022-05-26 ENCOUNTER — Ambulatory Visit (INDEPENDENT_AMBULATORY_CARE_PROVIDER_SITE_OTHER): Payer: Medicaid Other | Admitting: Obstetrics & Gynecology

## 2022-05-26 VITALS — BP 116/71 | HR 76 | Wt 249.0 lb

## 2022-05-26 DIAGNOSIS — Z3A10 10 weeks gestation of pregnancy: Secondary | ICD-10-CM | POA: Diagnosis not present

## 2022-05-26 DIAGNOSIS — Z3480 Encounter for supervision of other normal pregnancy, unspecified trimester: Secondary | ICD-10-CM | POA: Diagnosis not present

## 2022-05-26 DIAGNOSIS — Z3481 Encounter for supervision of other normal pregnancy, first trimester: Secondary | ICD-10-CM | POA: Diagnosis not present

## 2022-05-26 DIAGNOSIS — O98519 Other viral diseases complicating pregnancy, unspecified trimester: Secondary | ICD-10-CM

## 2022-05-26 DIAGNOSIS — Z3490 Encounter for supervision of normal pregnancy, unspecified, unspecified trimester: Secondary | ICD-10-CM

## 2022-05-26 DIAGNOSIS — Z141 Cystic fibrosis carrier: Secondary | ICD-10-CM

## 2022-05-26 DIAGNOSIS — B009 Herpesviral infection, unspecified: Secondary | ICD-10-CM

## 2022-05-26 NOTE — Progress Notes (Signed)
Subjective:    Sarah Villanueva is a G3P1011 20w2dbeing seen today for her first obstetrical visit.  Her obstetrical history is significant for large for gestational age. Cramping and spotting but not severe or often.  N/V and does not want meds.  Patient does intend to breast feed. Pregnancy history fully reviewed.  CF positive and FOB is negative.  Patient reports nausea and vomiting.  Vitals:   05/26/22 1442  BP: 116/71  Pulse: 76  Weight: 249 lb (112.9 kg)    HISTORY: OB History  Gravida Para Term Preterm AB Living  3 1 1   1 1  $ SAB IAB Ectopic Multiple Live Births    1   0 1    # Outcome Date GA Lbr Len/2nd Weight Sex Delivery Anes PTL Lv  3 Current           2 Term 07/16/19 433w4d0:20 / 00:10 9 lb 9.3 oz (4.346 kg) M Vag-Spont EPI  LIV  1 IAB 2016           Past Medical History:  Diagnosis Date   Cystic fibrosis carrier    HSV infection    on Valtrex   Obesity    Trichomonal vaginitis during pregnancy    History reviewed. No pertinent surgical history. Family History  Problem Relation Age of Onset   Cervical cancer Mother    Diabetes Father    Hypertension Father    Hypertension Paternal Grandmother    Diabetes Paternal Grandmother    Hypertension Paternal Grandfather    Diabetes Paternal Grandfather    Colon cancer Neg Hx      Exam    Uterus:     Pelvic Exam:    Perineum: No Hemorrhoids   Vulva: normal   Vagina:  normal mucosa, normal discharge   pH: N/a   Cervix: Small ectropion; spotting after pap smear   Adnexa: normal adnexa   Bony Pelvis: average  System: Breast:  normal appearance, no masses or tenderness, declined breast exam   Skin: normal coloration and turgor, no rashes    Neurologic: oriented, normal mood   Extremities: no deformities   HEENT sclera clear, anicteric, oropharynx clear, no lesions, and neck supple with midline trachea   Mouth/Teeth mucous membranes moist, pharynx normal without lesions and dental hygiene poor   Neck  supple and no masses   Cardiovascular: regular rate and rhythm   Respiratory:  appears well, vitals normal, no respiratory distress, acyanotic, normal RR, chest clear, no wheezing, crepitations, rhonchi, normal symmetric air entry   Abdomen: soft, non-tender; bowel sounds normal; no masses,  no organomegaly   Urinary: urethral meatus normal      Assessment:    Pregnancy: G3P1011 Patient Active Problem List   Diagnosis Date Noted   Postpartum exam 08/17/2019   Post-dates pregnancy 07/16/2019   Recurrent genital HSV (herpes simplex virus) infection 05/31/2019   Large for gestational age fetus in third trimester 05/31/2019   Excess weight gain in pregnancy 04/26/2019   Herpes simplex infection during pregnancy 03/07/2019   Trichomonal vaginitis in pregnancy 03/07/2019   Cystic fibrosis carrier 02/22/2019   Not immune to rubella 01/05/2019   Supervision of normal pregnancy 12/16/2018   Obesity in pregnancy 12/16/2018        Plan:  Initial labs drawn. Prenatal vitamins. Problem list reviewed and updated. Genetic Screening discussed and declined Ultrasound discussed; fetal survey: ordered. Follow up in 8-9 weeks as long as BPs are crossing with Baby Rx CF carrier--FOB  is negative N/V--pt gaining weight and declines medications HSV--Rs Valtrex at 36 weeks    Silas Sacramento 05/26/2022

## 2022-05-26 NOTE — Progress Notes (Signed)
Pt declined genetic testing

## 2022-05-26 NOTE — Progress Notes (Signed)
Bedside U/S shows single active IUP with CRL measuring 34.33 mm which is consistent with LMP.  Pos FHT

## 2022-05-27 ENCOUNTER — Telehealth: Payer: Self-pay | Admitting: *Deleted

## 2022-05-27 NOTE — Telephone Encounter (Signed)
Left patient a message to call and update the office with insurance information. Patient was not charged self pay price as discussed during scheduling by front office personal that was in the office on 05/26/2022 and insurance is not in the system.

## 2022-05-29 LAB — PREGNANCY, INITIAL SCREEN
Antibody Screen: NEGATIVE
Basophils Absolute: 0 10*3/uL (ref 0.0–0.2)
Basos: 0 %
Bilirubin, UA: NEGATIVE
Chlamydia trachomatis, NAA: NEGATIVE
EOS (ABSOLUTE): 0.1 10*3/uL (ref 0.0–0.4)
Eos: 1 %
Glucose, UA: NEGATIVE
HCV Ab: NONREACTIVE
HIV Screen 4th Generation wRfx: NONREACTIVE
Hematocrit: 36.8 % (ref 34.0–46.6)
Hemoglobin: 11.8 g/dL (ref 11.1–15.9)
Hepatitis B Surface Ag: NEGATIVE
Immature Grans (Abs): 0 10*3/uL (ref 0.0–0.1)
Immature Granulocytes: 0 %
Ketones, UA: NEGATIVE
Leukocytes,UA: NEGATIVE
Lymphocytes Absolute: 2.8 10*3/uL (ref 0.7–3.1)
Lymphs: 25 %
MCH: 26.2 pg — ABNORMAL LOW (ref 26.6–33.0)
MCHC: 32.1 g/dL (ref 31.5–35.7)
MCV: 82 fL (ref 79–97)
Monocytes Absolute: 0.6 10*3/uL (ref 0.1–0.9)
Monocytes: 6 %
Neisseria Gonorrhoeae by PCR: NEGATIVE
Neutrophils Absolute: 7.5 10*3/uL — ABNORMAL HIGH (ref 1.4–7.0)
Neutrophils: 68 %
Nitrite, UA: NEGATIVE
Platelets: 345 10*3/uL (ref 150–450)
Protein,UA: NEGATIVE
RBC: 4.51 x10E6/uL (ref 3.77–5.28)
RDW: 13.2 % (ref 11.7–15.4)
RPR Ser Ql: NONREACTIVE
Rh Factor: POSITIVE
Rubella Antibodies, IGG: <0.9 index — ABNORMAL LOW (ref >0.99)
Specific Gravity, UA: 1.025 (ref 1.005–1.030)
Urobilinogen, Ur: 0.2 mg/dL (ref 0.2–1.0)
WBC: 11 10*3/uL — ABNORMAL HIGH (ref 3.4–10.8)
pH, UA: 6 (ref 5.0–7.5)

## 2022-05-29 LAB — HCV INTERPRETATION

## 2022-05-29 LAB — CYTOLOGY - PAP: Diagnosis: NEGATIVE

## 2022-05-29 LAB — MICROSCOPIC EXAMINATION
Casts: NONE SEEN /lpf
Epithelial Cells (non renal): >10 /hpf — AB (ref 0–10)

## 2022-05-29 LAB — URINE CULTURE, OB REFLEX

## 2022-06-04 ENCOUNTER — Other Ambulatory Visit: Payer: Self-pay | Admitting: Obstetrics and Gynecology

## 2022-06-04 ENCOUNTER — Encounter: Payer: Self-pay | Admitting: Obstetrics and Gynecology

## 2022-06-04 DIAGNOSIS — R8271 Bacteriuria: Secondary | ICD-10-CM

## 2022-06-04 MED ORDER — CEFADROXIL 500 MG PO CAPS: 500.0000 mg | ORAL_CAPSULE | Freq: Two times a day (BID) | ORAL | 0 refills | Status: DC

## 2022-07-28 ENCOUNTER — Ambulatory Visit: Payer: Self-pay

## 2022-07-28 DIAGNOSIS — O0990 Supervision of high risk pregnancy, unspecified, unspecified trimester: Secondary | ICD-10-CM | POA: Insufficient documentation

## 2022-07-28 DIAGNOSIS — O0993 Supervision of high risk pregnancy, unspecified, third trimester: Secondary | ICD-10-CM | POA: Insufficient documentation

## 2022-07-28 DIAGNOSIS — Z3490 Encounter for supervision of normal pregnancy, unspecified, unspecified trimester: Secondary | ICD-10-CM | POA: Insufficient documentation

## 2022-07-30 ENCOUNTER — Encounter: Payer: Self-pay | Admitting: *Deleted

## 2022-07-30 ENCOUNTER — Ambulatory Visit: Payer: Medicaid Other

## 2022-07-30 ENCOUNTER — Ambulatory Visit: Payer: Medicaid Other | Attending: Obstetrics & Gynecology

## 2022-07-30 ENCOUNTER — Other Ambulatory Visit: Payer: Self-pay | Admitting: *Deleted

## 2022-07-30 VITALS — BP 115/67 | HR 95

## 2022-07-30 DIAGNOSIS — O09212 Supervision of pregnancy with history of pre-term labor, second trimester: Secondary | ICD-10-CM | POA: Diagnosis not present

## 2022-07-30 DIAGNOSIS — O98512 Other viral diseases complicating pregnancy, second trimester: Secondary | ICD-10-CM | POA: Insufficient documentation

## 2022-07-30 DIAGNOSIS — Z362 Encounter for other antenatal screening follow-up: Secondary | ICD-10-CM

## 2022-07-30 DIAGNOSIS — B009 Herpesviral infection, unspecified: Secondary | ICD-10-CM | POA: Insufficient documentation

## 2022-07-30 DIAGNOSIS — O0990 Supervision of high risk pregnancy, unspecified, unspecified trimester: Secondary | ICD-10-CM

## 2022-07-30 DIAGNOSIS — O99212 Obesity complicating pregnancy, second trimester: Secondary | ICD-10-CM | POA: Insufficient documentation

## 2022-07-30 DIAGNOSIS — Z3A19 19 weeks gestation of pregnancy: Secondary | ICD-10-CM | POA: Diagnosis not present

## 2022-07-30 DIAGNOSIS — Z363 Encounter for antenatal screening for malformations: Secondary | ICD-10-CM | POA: Insufficient documentation

## 2022-07-30 DIAGNOSIS — Z3480 Encounter for supervision of other normal pregnancy, unspecified trimester: Secondary | ICD-10-CM | POA: Diagnosis not present

## 2022-07-31 ENCOUNTER — Ambulatory Visit (INDEPENDENT_AMBULATORY_CARE_PROVIDER_SITE_OTHER): Payer: Medicaid Other | Admitting: Obstetrics and Gynecology

## 2022-07-31 VITALS — BP 102/64 | HR 75 | Wt 252.0 lb

## 2022-07-31 DIAGNOSIS — Z789 Other specified health status: Secondary | ICD-10-CM

## 2022-07-31 DIAGNOSIS — Z141 Cystic fibrosis carrier: Secondary | ICD-10-CM

## 2022-07-31 DIAGNOSIS — R8271 Bacteriuria: Secondary | ICD-10-CM

## 2022-07-31 DIAGNOSIS — O0990 Supervision of high risk pregnancy, unspecified, unspecified trimester: Secondary | ICD-10-CM | POA: Diagnosis not present

## 2022-07-31 DIAGNOSIS — Z8619 Personal history of other infectious and parasitic diseases: Secondary | ICD-10-CM

## 2022-07-31 DIAGNOSIS — Z3492 Encounter for supervision of normal pregnancy, unspecified, second trimester: Secondary | ICD-10-CM

## 2022-07-31 DIAGNOSIS — Z3A19 19 weeks gestation of pregnancy: Secondary | ICD-10-CM

## 2022-07-31 DIAGNOSIS — Z3009 Encounter for other general counseling and advice on contraception: Secondary | ICD-10-CM

## 2022-07-31 NOTE — Progress Notes (Signed)
   PRENATAL VISIT NOTE  Subjective:  Sarah Villanueva is a 32 y.o. G3P1011 at [redacted]w[redacted]d being seen today for ongoing prenatal care.  She is currently monitored for the following issues for this low-risk pregnancy and has BMI 32.0-32.9,adult; Not immune to rubella; Cystic fibrosis carrier; Herpes simplex infection during pregnancy; GBS bacteriuria; and Supervision of low-risk pregnancy on their problem list.  Patient reports doing well overall.  Contractions: Not present. Vag. Bleeding: None.  Movement: Present. Denies leaking of fluid.   The following portions of the patient's history were reviewed and updated as appropriate: allergies, current medications, past family history, past medical history, past social history, past surgical history and problem list.   Objective:   Vitals:   07/31/22 1000  BP: 102/64  Pulse: 75  Weight: 252 lb (114.3 kg)    Fetal Status: Fetal Heart Rate (bpm): 134   Movement: Present     General:  Alert, oriented and cooperative. Patient is in no acute distress.  Skin: Skin is warm and dry. No rash noted.   Cardiovascular: Normal heart rate noted  Respiratory: Normal respiratory effort, no problems with respiration noted  Abdomen: Soft, gravid, appropriate for gestational age.  Pain/Pressure: Present      Assessment and Plan:  Pregnancy: G3P1011 at [redacted]w[redacted]d 1. Encounter for supervision of low-risk pregnancy in second trimester 19 weeks Discussed & offered AFP, pt accepts Anatomy US incomplete but normal, repeat planned in 5 weeks - AFP, Serum, Open Spina Bifida  2. GBS bacteriuria TOC today given >100k cfu & treatment Discussed PCN in labor - Culture, OB Urine  3. History of herpes simplex infection Suppression at 35-36wk  4. Cystic fibrosis carrier FOB negative  5. Not immune to rubella PP MMR  6. Sterilization consult - She is interested in permanent sterilization. Discussed alternatives including LARC and vasectomy.  - Discussed surgery of  salpingectomy vs tubal ligation with recommendation for salpingectomy due to lower risk of contraceptive failure and potential to reduce risk for ovarian cancer. - Reviewed timing of surgery with possibility of immediate postpartum vs interval laparoscopic salpingectomy. Briefly reviewed surgical risks. - She will consider her options. Continue to address.  - Sign MA-31 next appt  Preterm labor symptoms and general obstetric precautions including but not limited to vaginal bleeding, contractions, leaking of fluid and fetal movement were reviewed in detail with the patient. Please refer to After Visit Summary for other counseling recommendations.   Return in about 4 weeks (around 08/28/2022) for return OB at 23-24 weeks.  Future Appointments  Date Time Provider Department Center  09/02/2022  3:45 PM [facility]    Lennart Pall, MD

## 2022-08-02 LAB — AFP, SERUM, OPEN SPINA BIFIDA
AFP MoM: 1.26
AFP Value: 49.8 ng/mL
Gest. Age on Collection Date: 19.4 weeks
Maternal Age At EDD: 30.1 yr
OSBR Risk 1 IN: 5411
Test Results:: NEGATIVE
Weight: 252 [lb_av]

## 2022-08-04 LAB — URINE CULTURE, OB REFLEX

## 2022-08-04 LAB — CULTURE, OB URINE

## 2022-09-02 ENCOUNTER — Ambulatory Visit: Payer: Medicaid Other | Attending: Maternal & Fetal Medicine

## 2022-09-02 DIAGNOSIS — Z3A24 24 weeks gestation of pregnancy: Secondary | ICD-10-CM | POA: Diagnosis not present

## 2022-09-02 DIAGNOSIS — O285 Abnormal chromosomal and genetic finding on antenatal screening of mother: Secondary | ICD-10-CM | POA: Diagnosis not present

## 2022-09-02 DIAGNOSIS — Z362 Encounter for other antenatal screening follow-up: Secondary | ICD-10-CM | POA: Insufficient documentation

## 2022-09-02 DIAGNOSIS — B009 Herpesviral infection, unspecified: Secondary | ICD-10-CM | POA: Diagnosis not present

## 2022-09-02 DIAGNOSIS — O99212 Obesity complicating pregnancy, second trimester: Secondary | ICD-10-CM | POA: Diagnosis not present

## 2022-09-02 DIAGNOSIS — Z141 Cystic fibrosis carrier: Secondary | ICD-10-CM

## 2022-09-02 DIAGNOSIS — O98512 Other viral diseases complicating pregnancy, second trimester: Secondary | ICD-10-CM

## 2022-09-02 DIAGNOSIS — E669 Obesity, unspecified: Secondary | ICD-10-CM | POA: Diagnosis not present

## 2022-09-02 DIAGNOSIS — O09292 Supervision of pregnancy with other poor reproductive or obstetric history, second trimester: Secondary | ICD-10-CM | POA: Diagnosis not present

## 2022-09-12 ENCOUNTER — Telehealth: Payer: Self-pay | Admitting: *Deleted

## 2022-09-12 NOTE — Telephone Encounter (Signed)
Left patient a message to call and schedule 32 week appointment. Will be 32 weeks on Sunday, October 26, 2022.

## 2022-09-25 ENCOUNTER — Ambulatory Visit (INDEPENDENT_AMBULATORY_CARE_PROVIDER_SITE_OTHER): Payer: Medicaid Other | Admitting: Obstetrics and Gynecology

## 2022-09-25 VITALS — BP 103/70 | HR 82 | Wt 259.0 lb

## 2022-09-25 DIAGNOSIS — Z23 Encounter for immunization: Secondary | ICD-10-CM | POA: Diagnosis not present

## 2022-09-25 DIAGNOSIS — Z3A27 27 weeks gestation of pregnancy: Secondary | ICD-10-CM

## 2022-09-25 DIAGNOSIS — Z3482 Encounter for supervision of other normal pregnancy, second trimester: Secondary | ICD-10-CM

## 2022-09-25 DIAGNOSIS — Z3492 Encounter for supervision of normal pregnancy, unspecified, second trimester: Secondary | ICD-10-CM | POA: Diagnosis not present

## 2022-09-25 DIAGNOSIS — R8271 Bacteriuria: Secondary | ICD-10-CM

## 2022-09-25 DIAGNOSIS — Z141 Cystic fibrosis carrier: Secondary | ICD-10-CM

## 2022-09-25 DIAGNOSIS — Z789 Other specified health status: Secondary | ICD-10-CM

## 2022-09-25 DIAGNOSIS — Z3009 Encounter for other general counseling and advice on contraception: Secondary | ICD-10-CM

## 2022-09-25 DIAGNOSIS — Z8619 Personal history of other infectious and parasitic diseases: Secondary | ICD-10-CM

## 2022-09-25 NOTE — Addendum Note (Signed)
Addended by: Granville Lewis on: 09/25/2022 08:46 AM   Modules accepted: Orders

## 2022-09-25 NOTE — Progress Notes (Signed)
   PRENATAL VISIT NOTE  Subjective:  Sarah Villanueva is a 32 y.o. G3P1011 at [redacted]w[redacted]d being seen today for ongoing prenatal care.  She is currently monitored for the following issues for this low-risk pregnancy and has BMI 32.0-32.9,adult; Not immune to rubella; Cystic fibrosis carrier; Herpes simplex infection during pregnancy; GBS bacteriuria; Supervision of low-risk pregnancy; and Sterilization consult on their problem list.  Patient reports no complaints.  Contractions: Irritability. Vag. Bleeding: None.  Movement: Present. Denies leaking of fluid.   The following portions of the patient's history were reviewed and updated as appropriate: allergies, current medications, past family history, past medical history, past social history, past surgical history and problem list.   Objective:   Vitals:   09/25/22 0806  BP: 103/70  Pulse: 82  Weight: 259 lb (117.5 kg)    Fetal Status: Fetal Heart Rate (bpm): 141 Fundal Height: 30 cm Movement: Present     General:  Alert, oriented and cooperative. Patient is in no acute distress.  Skin: Skin is warm and dry. No rash noted.   Cardiovascular: Normal heart rate noted  Respiratory: Normal respiratory effort, no problems with respiration noted  Abdomen: Soft, gravid, appropriate for gestational age.  Pain/Pressure: Absent      Assessment and Plan:  Pregnancy: G3P1011 at [redacted]w[redacted]d 1. Encounter for supervision of low-risk pregnancy in second trimester 2. [redacted] weeks gestation of pregnancy Tdap today - Glucose Tolerance, 2 Hours w/1 Hour - CBC - RPR - HIV antibody (with reflex)  3. Sterilization consult Consent signed today (6/20)  Still undecided on final MOC. Partner is considering vasectomy.  4. GBS bacteriuria PCN in labor  5. History of herpes genitalis Suppression at 35-36w  6. Cystic fibrosis carrier FOB neg  7. Not immune to rubella PP MMR  Please refer to After Visit Summary for other counseling recommendations.   Return in  about 2 weeks (around 10/09/2022) for return OB at 29 weeks.  Future Appointments  Date Time Provider Department Center  10/30/2022  8:30 AM Lennart Pall, MD [facility]   Lennart Pall, MD

## 2022-09-26 LAB — CBC
Hematocrit: 31.1 % — ABNORMAL LOW (ref 34.0–46.6)
Hemoglobin: 10.3 g/dL — ABNORMAL LOW (ref 11.1–15.9)
MCH: 27.4 pg (ref 26.6–33.0)
MCHC: 33.1 g/dL (ref 31.5–35.7)
MCV: 83 fL (ref 79–97)
Platelets: 277 10*3/uL (ref 150–450)
RBC: 3.76 x10E6/uL — ABNORMAL LOW (ref 3.77–5.28)
RDW: 13.5 % (ref 11.7–15.4)
WBC: 9.9 10*3/uL (ref 3.4–10.8)

## 2022-09-26 LAB — HIV ANTIBODY (ROUTINE TESTING W REFLEX): HIV Screen 4th Generation wRfx: NONREACTIVE

## 2022-09-26 LAB — RPR: RPR Ser Ql: NONREACTIVE

## 2022-09-26 LAB — GLUCOSE TOLERANCE, 2 HOURS W/ 1HR
Glucose, 1 hour: 185 mg/dL — ABNORMAL HIGH (ref 70–179)
Glucose, 2 hour: 106 mg/dL (ref 70–152)
Glucose, Fasting: 99 mg/dL — ABNORMAL HIGH (ref 70–91)

## 2022-09-26 MED ORDER — FERROUS SULFATE 325 (65 FE) MG PO TABS: 325.0000 mg | ORAL_TABLET | ORAL | 1 refills | Status: DC

## 2022-09-26 NOTE — Addendum Note (Signed)
Addended by: Harvie Bridge on: 09/26/2022 08:13 AM   Modules accepted: Orders

## 2022-09-29 ENCOUNTER — Other Ambulatory Visit: Payer: Self-pay | Admitting: *Deleted

## 2022-09-29 DIAGNOSIS — O24419 Gestational diabetes mellitus in pregnancy, unspecified control: Secondary | ICD-10-CM

## 2022-09-29 NOTE — Progress Notes (Signed)
Referral placed for N & D for gestational diabetes.

## 2022-10-08 ENCOUNTER — Encounter: Payer: Medicaid Other | Attending: Obstetrics and Gynecology | Admitting: Registered"

## 2022-10-08 DIAGNOSIS — O24419 Gestational diabetes mellitus in pregnancy, unspecified control: Secondary | ICD-10-CM | POA: Insufficient documentation

## 2022-10-09 ENCOUNTER — Encounter: Payer: Self-pay | Admitting: Registered"

## 2022-10-09 DIAGNOSIS — O24415 Gestational diabetes mellitus in pregnancy, controlled by oral hypoglycemic drugs: Secondary | ICD-10-CM | POA: Insufficient documentation

## 2022-10-09 DIAGNOSIS — O24419 Gestational diabetes mellitus in pregnancy, unspecified control: Secondary | ICD-10-CM | POA: Insufficient documentation

## 2022-10-09 NOTE — Progress Notes (Signed)
Patient was seen on 10/08/22 for Gestational Diabetes self-management class at the [facility]. The following learning objectives were met by the patient during this course:  States the definition of Gestational Diabetes States why dietary management is important in controlling blood glucose Describes the effects each nutrient has on blood glucose levels Demonstrates ability to create a balanced meal plan Demonstrates carbohydrate counting  States when to check blood glucose levels Demonstrates proper blood glucose monitoring techniques States the effect of stress and exercise on blood glucose levels States the importance of limiting caffeine and abstaining from alcohol and smoking  Blood glucose monitor given: none  Patient instructed to monitor glucose levels: FBS: 60 - <90 1 hour: <140 2 hour: <120  *Patient received handouts: Nutrition Diabetes and Pregnancy Carbohydrate Counting List  Patient will be seen for follow-up as needed.  

## 2022-10-16 ENCOUNTER — Ambulatory Visit (INDEPENDENT_AMBULATORY_CARE_PROVIDER_SITE_OTHER): Payer: Medicaid Other | Admitting: Obstetrics and Gynecology

## 2022-10-16 VITALS — BP 104/71 | HR 88 | Wt 260.0 lb

## 2022-10-16 DIAGNOSIS — O98519 Other viral diseases complicating pregnancy, unspecified trimester: Secondary | ICD-10-CM

## 2022-10-16 DIAGNOSIS — O24419 Gestational diabetes mellitus in pregnancy, unspecified control: Secondary | ICD-10-CM

## 2022-10-16 DIAGNOSIS — Z3493 Encounter for supervision of normal pregnancy, unspecified, third trimester: Secondary | ICD-10-CM

## 2022-10-16 DIAGNOSIS — Z3A30 30 weeks gestation of pregnancy: Secondary | ICD-10-CM

## 2022-10-16 DIAGNOSIS — B009 Herpesviral infection, unspecified: Secondary | ICD-10-CM

## 2022-10-16 DIAGNOSIS — Z3009 Encounter for other general counseling and advice on contraception: Secondary | ICD-10-CM

## 2022-10-16 DIAGNOSIS — Z141 Cystic fibrosis carrier: Secondary | ICD-10-CM

## 2022-10-16 DIAGNOSIS — R8271 Bacteriuria: Secondary | ICD-10-CM

## 2022-10-16 DIAGNOSIS — Z789 Other specified health status: Secondary | ICD-10-CM

## 2022-10-17 ENCOUNTER — Encounter: Payer: Self-pay | Admitting: Obstetrics and Gynecology

## 2022-10-17 NOTE — Progress Notes (Signed)
   PRENATAL VISIT NOTE  Subjective:  Sarah Villanueva is a 32 y.o. G3P1011 at [redacted]w[redacted]d being seen today for ongoing prenatal care.  She is currently monitored for the following issues for this high-risk pregnancy and has BMI 32.0-32.9,adult; Not immune to rubella; Cystic fibrosis carrier; Herpes simplex infection during pregnancy; GBS bacteriuria; Supervision of low-risk pregnancy; Sterilization consult; and Gestational diabetes mellitus (GDM), antepartum on their problem list.  Patient reports no complaints.  Did not bring BG log for review. Reports elevated fasting, normal postprandial. Contractions: Not present. Vag. Bleeding: None.  Movement: Present. Denies leaking of fluid.   The following portions of the patient's history were reviewed and updated as appropriate: allergies, current medications, past family history, past medical history, past social history, past surgical history and problem list.   Objective:   Vitals:   10/16/22 1507  BP: 104/71  Pulse: 88  Weight: 260 lb (117.9 kg)    Fetal Status: Fetal Heart Rate (bpm): 137   Movement: Present     General:  Alert, oriented and cooperative. Patient is in no acute distress.  Skin: Skin is warm and dry. No rash noted.   Cardiovascular: Normal heart rate noted  Respiratory: Normal respiratory effort, no problems with respiration noted  Abdomen: Soft, gravid, appropriate for gestational age.  Pain/Pressure: Absent      Assessment and Plan:  Pregnancy: G3P1011 at [redacted]w[redacted]d 1. Encounter for supervision of low-risk pregnancy in third trimester 2. [redacted] weeks gestation of pregnancy  3. Gestational diabetes mellitus (GDM), antepartum, gestational diabetes method of control unspecified Does not have BG log for review. Reports elevated fasting and mostly normal postprandial but she is still working on lifestyle changes. Will send log after appt.  - Reviewed diagnosis of GDM - Discussed the risks associated in pregnancy especially with poor  control including but not limited to increased risk of preeclampsia, macrosomia, need for operative delivery I.e. vacuum, forcep, c-section, shoulder dystocia and resulting potential nerve injury.  - We discussed the possibility of management of the pregnancy with medications I.e. Metformin or Insulin. Discussed if medication initiated, that monitoring during the pregnancy will be started at 32 wks or at the time of medication initiation.  - Discussed serial growth [place] & delivery around 39 weeks - Counseled on importance of postpartum follow up testing to ensure resolution and ensure patient does not have ongoing DM - Discussed lifelong increased risk of DM and increased risk of GDM in future pregnancies - [place] MFM OB FOLLOW UP; Future -- scheduled 7/15  4. Sterilization consult Consent signed 6/20, partner considering vasectomy. Will continue to address  5. GBS bacteriuria PCN in labor  6. Herpes simplex infection in mother during pregnancy, antepartum Suppression at 35-36wk  7. Cystic fibrosis carrier FOB neg  8. Not immune to rubella PP MMR  Please refer to After Visit Summary for other counseling recommendations.   Return in about 2 weeks (around 10/30/2022) for return OB at 32 weeks.  Future Appointments  Date Time Provider Department Center  10/20/2022  9:45 AM [facility] NURSE [facility]  10/20/2022 10:00 AM [facility] US1 [facility]  10/30/2022  8:30 AM Lennart Pall, MD [facility]  11/25/2022  1:10 PM Rasch, Harolyn Rutherford, NP [facility]    Lennart Pall, MD

## 2022-10-20 ENCOUNTER — Encounter: Payer: Self-pay | Admitting: Obstetrics and Gynecology

## 2022-10-20 ENCOUNTER — Other Ambulatory Visit: Payer: Self-pay | Admitting: *Deleted

## 2022-10-20 ENCOUNTER — Ambulatory Visit: Payer: Medicaid Other | Admitting: *Deleted

## 2022-10-20 ENCOUNTER — Ambulatory Visit: Payer: Medicaid Other | Attending: Obstetrics and Gynecology

## 2022-10-20 VITALS — BP 112/60 | HR 82

## 2022-10-20 DIAGNOSIS — O09293 Supervision of pregnancy with other poor reproductive or obstetric history, third trimester: Secondary | ICD-10-CM

## 2022-10-20 DIAGNOSIS — Z141 Cystic fibrosis carrier: Secondary | ICD-10-CM | POA: Diagnosis not present

## 2022-10-20 DIAGNOSIS — O24410 Gestational diabetes mellitus in pregnancy, diet controlled: Secondary | ICD-10-CM

## 2022-10-20 DIAGNOSIS — O99213 Obesity complicating pregnancy, third trimester: Secondary | ICD-10-CM | POA: Diagnosis not present

## 2022-10-20 DIAGNOSIS — O98513 Other viral diseases complicating pregnancy, third trimester: Secondary | ICD-10-CM | POA: Diagnosis not present

## 2022-10-20 DIAGNOSIS — O3660X0 Maternal care for excessive fetal growth, unspecified trimester, not applicable or unspecified: Secondary | ICD-10-CM | POA: Insufficient documentation

## 2022-10-20 DIAGNOSIS — E669 Obesity, unspecified: Secondary | ICD-10-CM

## 2022-10-20 DIAGNOSIS — O285 Abnormal chromosomal and genetic finding on antenatal screening of mother: Secondary | ICD-10-CM

## 2022-10-20 DIAGNOSIS — O24419 Gestational diabetes mellitus in pregnancy, unspecified control: Secondary | ICD-10-CM | POA: Diagnosis present

## 2022-10-20 DIAGNOSIS — Z3A31 31 weeks gestation of pregnancy: Secondary | ICD-10-CM

## 2022-10-20 DIAGNOSIS — B009 Herpesviral infection, unspecified: Secondary | ICD-10-CM

## 2022-10-29 DIAGNOSIS — O24419 Gestational diabetes mellitus in pregnancy, unspecified control: Secondary | ICD-10-CM | POA: Diagnosis not present

## 2022-10-30 ENCOUNTER — Ambulatory Visit (INDEPENDENT_AMBULATORY_CARE_PROVIDER_SITE_OTHER): Payer: Medicaid Other | Admitting: Obstetrics and Gynecology

## 2022-10-30 VITALS — BP 106/72 | HR 90 | Wt 260.0 lb

## 2022-10-30 DIAGNOSIS — B009 Herpesviral infection, unspecified: Secondary | ICD-10-CM

## 2022-10-30 DIAGNOSIS — R8271 Bacteriuria: Secondary | ICD-10-CM

## 2022-10-30 DIAGNOSIS — O98519 Other viral diseases complicating pregnancy, unspecified trimester: Secondary | ICD-10-CM

## 2022-10-30 DIAGNOSIS — O3663X0 Maternal care for excessive fetal growth, third trimester, not applicable or unspecified: Secondary | ICD-10-CM

## 2022-10-30 DIAGNOSIS — Z3009 Encounter for other general counseling and advice on contraception: Secondary | ICD-10-CM

## 2022-10-30 DIAGNOSIS — Z3A32 32 weeks gestation of pregnancy: Secondary | ICD-10-CM

## 2022-10-30 DIAGNOSIS — O24415 Gestational diabetes mellitus in pregnancy, controlled by oral hypoglycemic drugs: Secondary | ICD-10-CM

## 2022-10-30 DIAGNOSIS — Z789 Other specified health status: Secondary | ICD-10-CM

## 2022-10-30 DIAGNOSIS — Z3493 Encounter for supervision of normal pregnancy, unspecified, third trimester: Secondary | ICD-10-CM

## 2022-10-30 DIAGNOSIS — Z141 Cystic fibrosis carrier: Secondary | ICD-10-CM

## 2022-10-30 MED ORDER — METFORMIN HCL 500 MG PO TABS: 500.0000 mg | ORAL_TABLET | Freq: Two times a day (BID) | ORAL | 5 refills | Status: DC

## 2022-10-30 NOTE — Addendum Note (Signed)
Addended by: Harvie Bridge on: 10/30/2022 09:34 AM   Modules accepted: Orders

## 2022-10-30 NOTE — Progress Notes (Signed)
   PRENATAL VISIT NOTE  Subjective:  Sarah Villanueva is a 32 y.o. G3P1011 at [redacted]w[redacted]d being seen today for ongoing prenatal care.  She is currently monitored for the following issues for this high-risk pregnancy and has BMI 32.0-32.9,adult; Not immune to rubella; Cystic fibrosis carrier; Herpes simplex infection during pregnancy; GBS bacteriuria; Supervision of low-risk pregnancy; Sterilization consult; Gestational diabetes mellitus (GDM), antepartum; and LGA (large for gestational age) fetus affecting management of mother on their problem list.  Patient reports no complaints.  Contractions: Not present. Vag. Bleeding: None.  Movement: Present. Denies leaking of fluid.   The following portions of the patient's history were reviewed and updated as appropriate: allergies, current medications, past family history, past medical history, past social history, past surgical history and problem list.   Objective:   Vitals:   10/30/22 0814  BP: 106/72  Pulse: 90  Weight: 260 lb (117.9 kg)    Fetal Status: Fetal Heart Rate (bpm): 144   Movement: Present     General:  Alert, oriented and cooperative. Patient is in no acute distress.  Skin: Skin is warm and dry. No rash noted.   Cardiovascular: Normal heart rate noted  Respiratory: Normal respiratory effort, no problems with respiration noted  Abdomen: Soft, gravid, appropriate for gestational age.  Pain/Pressure: Absent      Assessment and Plan:  Pregnancy: G3P1011 at [redacted]w[redacted]d 1. Encounter for supervision of low-risk pregnancy in third trimester 2. [redacted] weeks gestation of pregnancy  3. Gestational diabetes mellitus (GDM) controlled on oral hypoglycemic drug, antepartum 4. Excessive fetal growth affecting management of pregnancy in third trimester, single or unspecified fetus Fasting 86-111 >50% above goal. Postprandial 90s-130s. Only 3 total elevated postprandial with diet changes Recommended medication initiation due to persistently elevated fasting  values. Pt prefers metformin. Will start MTF 500mg  BID Weekly BPPs ordered, will do NST today Growth 7/15 90%ile, next growth scheduled 8/15 IOL by 39 weeks, potentially sooner if unable to control BG or persistently LGA fetus -     metFORMIN (GLUCOPHAGE) 500 MG tablet; Take 1 tablet (500 mg total) by mouth 2 (two) times daily with a meal. - US Fetal BPP W/O Non Stress; Future  5. Sterilization consult Tubal if CS, partner will get vasectomy if vaginal delivery  6. GBS bacteriuria PCN in labor  7. Herpes simplex infection in mother during pregnancy, antepartum Will send valtrex for ppx next appt  8. Cystic fibrosis carrier Partner testing negative  9. Not immune to rubella PP MMR  Please refer to After Visit Summary for other counseling recommendations.   Return in about 2 weeks (around 11/13/2022) for return OB at 34 weeks with NST.  Future Appointments  Date Time Provider Department Center  11/20/2022 11:30 AM [facility] US3 [facility]  11/25/2022  1:10 PM Rasch, Harolyn Rutherford, NP [facility]   Lennart Pall, MD

## 2022-11-04 ENCOUNTER — Encounter: Payer: Self-pay | Admitting: Obstetrics and Gynecology

## 2022-11-06 ENCOUNTER — Ambulatory Visit: Payer: Medicaid Other | Attending: Obstetrics and Gynecology

## 2022-11-06 ENCOUNTER — Other Ambulatory Visit: Payer: Self-pay | Admitting: *Deleted

## 2022-11-06 ENCOUNTER — Ambulatory Visit: Payer: Medicaid Other

## 2022-11-06 ENCOUNTER — Ambulatory Visit: Payer: Medicaid Other | Admitting: *Deleted

## 2022-11-06 VITALS — BP 114/68 | HR 105

## 2022-11-06 DIAGNOSIS — O285 Abnormal chromosomal and genetic finding on antenatal screening of mother: Secondary | ICD-10-CM

## 2022-11-06 DIAGNOSIS — E669 Obesity, unspecified: Secondary | ICD-10-CM | POA: Diagnosis not present

## 2022-11-06 DIAGNOSIS — Z148 Genetic carrier of other disease: Secondary | ICD-10-CM

## 2022-11-06 DIAGNOSIS — O24415 Gestational diabetes mellitus in pregnancy, controlled by oral hypoglycemic drugs: Secondary | ICD-10-CM

## 2022-11-06 DIAGNOSIS — B009 Herpesviral infection, unspecified: Secondary | ICD-10-CM

## 2022-11-06 DIAGNOSIS — O99213 Obesity complicating pregnancy, third trimester: Secondary | ICD-10-CM | POA: Diagnosis not present

## 2022-11-06 DIAGNOSIS — Z3A33 33 weeks gestation of pregnancy: Secondary | ICD-10-CM

## 2022-11-06 DIAGNOSIS — O24419 Gestational diabetes mellitus in pregnancy, unspecified control: Secondary | ICD-10-CM

## 2022-11-06 DIAGNOSIS — O98513 Other viral diseases complicating pregnancy, third trimester: Secondary | ICD-10-CM

## 2022-11-12 ENCOUNTER — Ambulatory Visit: Payer: Medicaid Other | Attending: Obstetrics | Admitting: *Deleted

## 2022-11-12 DIAGNOSIS — Z141 Cystic fibrosis carrier: Secondary | ICD-10-CM | POA: Insufficient documentation

## 2022-11-12 DIAGNOSIS — Z3A33 33 weeks gestation of pregnancy: Secondary | ICD-10-CM

## 2022-11-12 DIAGNOSIS — O24419 Gestational diabetes mellitus in pregnancy, unspecified control: Secondary | ICD-10-CM

## 2022-11-12 DIAGNOSIS — O24410 Gestational diabetes mellitus in pregnancy, diet controlled: Secondary | ICD-10-CM | POA: Diagnosis not present

## 2022-11-12 DIAGNOSIS — O24415 Gestational diabetes mellitus in pregnancy, controlled by oral hypoglycemic drugs: Secondary | ICD-10-CM

## 2022-11-12 DIAGNOSIS — O09893 Supervision of other high risk pregnancies, third trimester: Secondary | ICD-10-CM | POA: Insufficient documentation

## 2022-11-12 DIAGNOSIS — Z3A34 34 weeks gestation of pregnancy: Secondary | ICD-10-CM | POA: Insufficient documentation

## 2022-11-12 DIAGNOSIS — O3660X0 Maternal care for excessive fetal growth, unspecified trimester, not applicable or unspecified: Secondary | ICD-10-CM | POA: Diagnosis not present

## 2022-11-12 NOTE — Procedures (Signed)
Sarah Villanueva 06/16/1990 [redacted]w[redacted]d  Fetus A Non-Stress Test Interpretation for 11/12/22-NST only  Indication: Gestational Diabetes medication controlled  Fetal Heart Rate A Mode: External Baseline Rate (A): 155 bpm Variability: Moderate Accelerations: 15 x 15 Decelerations: None Multiple birth?: No  Uterine Activity Mode: Toco Contraction Frequency (min): none Resting Tone Palpated: Relaxed  Interpretation (Fetal Testing) Nonstress Test Interpretation: Reactive Comments: Tracing reviewed byDr. Darra Lis

## 2022-11-13 ENCOUNTER — Telehealth (INDEPENDENT_AMBULATORY_CARE_PROVIDER_SITE_OTHER): Payer: Medicaid Other | Admitting: Obstetrics and Gynecology

## 2022-11-13 DIAGNOSIS — B009 Herpesviral infection, unspecified: Secondary | ICD-10-CM

## 2022-11-13 DIAGNOSIS — O3663X0 Maternal care for excessive fetal growth, third trimester, not applicable or unspecified: Secondary | ICD-10-CM

## 2022-11-13 DIAGNOSIS — O98513 Other viral diseases complicating pregnancy, third trimester: Secondary | ICD-10-CM

## 2022-11-13 DIAGNOSIS — Z141 Cystic fibrosis carrier: Secondary | ICD-10-CM

## 2022-11-13 DIAGNOSIS — Z6832 Body mass index (BMI) 32.0-32.9, adult: Secondary | ICD-10-CM

## 2022-11-13 DIAGNOSIS — Z3009 Encounter for other general counseling and advice on contraception: Secondary | ICD-10-CM

## 2022-11-13 DIAGNOSIS — Z3493 Encounter for supervision of normal pregnancy, unspecified, third trimester: Secondary | ICD-10-CM

## 2022-11-13 DIAGNOSIS — O24415 Gestational diabetes mellitus in pregnancy, controlled by oral hypoglycemic drugs: Secondary | ICD-10-CM

## 2022-11-13 DIAGNOSIS — Z789 Other specified health status: Secondary | ICD-10-CM

## 2022-11-13 DIAGNOSIS — R8271 Bacteriuria: Secondary | ICD-10-CM

## 2022-11-13 DIAGNOSIS — Z3A34 34 weeks gestation of pregnancy: Secondary | ICD-10-CM

## 2022-11-13 MED ORDER — VALACYCLOVIR HCL 500 MG PO TABS: 500.0000 mg | ORAL_TABLET | Freq: Two times a day (BID) | ORAL | 1 refills | Status: DC

## 2022-11-13 NOTE — Progress Notes (Signed)
    TELEHEALTH OBSTETRICS VISIT ENCOUNTER NOTE  Provider location: Center for [facility]   Patient location: Home  I connected with Everardo Pacific on 11/13/22 at  3:30 PM EDT by MyChart audiovisual encounter.   Subjective:  Monisa Heiskell is a 32 y.o. G3P1011 at [redacted]w[redacted]d being followed for ongoing prenatal care.  She is currently monitored for the following issues for this high-risk pregnancy and has BMI 32.0-32.9,adult; Not immune to rubella; Cystic fibrosis carrier; Herpes simplex infection during pregnancy; GBS bacteriuria; Supervision of low-risk pregnancy; Sterilization consult; Gestational diabetes mellitus (GDM), antepartum; and LGA (large for gestational age) fetus affecting management of mother on their problem list.  Patient reports  doing well overall . Reports fetal movement. Denies any contractions, bleeding or leaking of fluid.   The following portions of the patient's history were reviewed and updated as appropriate: allergies, current medications, past family history, past medical history, past social history, past surgical history and problem list.   Objective:  Last menstrual period 03/15/2022. General:  Alert, oriented and cooperative.   Mental Status: Normal mood and affect perceived. Normal judgment and thought content.  Rest of physical exam deferred due to type of encounter  Assessment and Plan:  Pregnancy: G3P1011 at [redacted]w[redacted]d  Encounter for supervision of low-risk pregnancy in third trimester [redacted] weeks gestation of pregnancy Home BP 120s/60-70s Discussed GC/CT next appt  Gestational diabetes mellitus (GDM) controlled on oral hypoglycemic drug, antepartum Excessive fetal growth affecting management of pregnancy in third trimester, single or unspecified fetus Verbally reviewed BG - fasting 80s, postprandial 100-110s Continue metformin 500mg  BID Growth 7/15 90%ile, next growth scheduled 8/15 Weekly BPPs IOL by 39 weeks, potentially sooner  if unable to control BG or persistently LGA fetus  Herpes simplex infection in mother during pregnancy, antepartum Discussed ppx to start at 36 weeks, rx sent -     valACYclovir (VALTREX) 500 MG tablet; Take 1 tablet (500 mg total) by mouth 2 (two) times daily.  GBS bacteriuria PCN in labor  Sterilization consult Tubal if CS, partner will get vasectomy if vaginal delivery Consent signed 6/20  Cystic fibrosis carrier Partner testing negative  Not immune to rubella PP MMR  BMI 32.0-32.9,adult  Please refer to After Visit Summary for other counseling recommendations.   I provided 15 minutes of non-face-to-face time during this encounter.  Return in about 2 weeks (around 11/27/2022).  Future Appointments  Date Time Provider Department Center  11/20/2022 11:30 AM [facility] US3 [facility]  11/25/2022  1:10 PM Rasch, Harolyn Rutherford, NP [facility]  11/27/2022  2:45 PM [facility] US6 [facility]  12/04/2022  8:45 AM [facility] NST [facility]  12/11/2022  8:45 AM [facility] NST [facility]  12/18/2022  9:30 AM [facility] US3 [facility]   Lennart Pall, MD Center for [employer]

## 2022-11-20 ENCOUNTER — Ambulatory Visit: Payer: Medicaid Other | Attending: Obstetrics

## 2022-11-20 DIAGNOSIS — O285 Abnormal chromosomal and genetic finding on antenatal screening of mother: Secondary | ICD-10-CM | POA: Diagnosis not present

## 2022-11-20 DIAGNOSIS — O98513 Other viral diseases complicating pregnancy, third trimester: Secondary | ICD-10-CM | POA: Diagnosis not present

## 2022-11-20 DIAGNOSIS — O24419 Gestational diabetes mellitus in pregnancy, unspecified control: Secondary | ICD-10-CM | POA: Diagnosis present

## 2022-11-20 DIAGNOSIS — O99213 Obesity complicating pregnancy, third trimester: Secondary | ICD-10-CM | POA: Insufficient documentation

## 2022-11-20 DIAGNOSIS — Z148 Genetic carrier of other disease: Secondary | ICD-10-CM

## 2022-11-20 DIAGNOSIS — B009 Herpesviral infection, unspecified: Secondary | ICD-10-CM

## 2022-11-20 DIAGNOSIS — Z3A35 35 weeks gestation of pregnancy: Secondary | ICD-10-CM | POA: Diagnosis not present

## 2022-11-20 DIAGNOSIS — O24415 Gestational diabetes mellitus in pregnancy, controlled by oral hypoglycemic drugs: Secondary | ICD-10-CM

## 2022-11-20 DIAGNOSIS — E669 Obesity, unspecified: Secondary | ICD-10-CM | POA: Diagnosis not present

## 2022-11-20 DIAGNOSIS — O09293 Supervision of pregnancy with other poor reproductive or obstetric history, third trimester: Secondary | ICD-10-CM | POA: Diagnosis not present

## 2022-11-25 ENCOUNTER — Ambulatory Visit (INDEPENDENT_AMBULATORY_CARE_PROVIDER_SITE_OTHER): Payer: Medicaid Other | Admitting: Obstetrics and Gynecology

## 2022-11-25 ENCOUNTER — Other Ambulatory Visit (HOSPITAL_COMMUNITY)
Admission: RE | Admit: 2022-11-25 | Discharge: 2022-11-25 | Disposition: A | Discharge status: Home / Self Care | Payer: Medicaid Other | Source: Ambulatory Visit | Attending: Obstetrics and Gynecology | Admitting: Obstetrics and Gynecology

## 2022-11-25 VITALS — BP 103/78 | HR 89 | Wt 265.0 lb

## 2022-11-25 DIAGNOSIS — O24415 Gestational diabetes mellitus in pregnancy, controlled by oral hypoglycemic drugs: Secondary | ICD-10-CM | POA: Diagnosis not present

## 2022-11-25 DIAGNOSIS — Z3A36 36 weeks gestation of pregnancy: Secondary | ICD-10-CM

## 2022-11-25 DIAGNOSIS — O0993 Supervision of high risk pregnancy, unspecified, third trimester: Secondary | ICD-10-CM | POA: Diagnosis not present

## 2022-11-25 NOTE — Progress Notes (Signed)
   PRENATAL VISIT NOTE  Subjective:  Sarah Villanueva is a 32 y.o. G3P1011 at [redacted]w[redacted]d being seen today for ongoing prenatal care.  She is currently monitored for the following issues for this high-risk pregnancy and has BMI 32.0-32.9,adult; Not immune to rubella; Cystic fibrosis carrier; Herpes simplex infection during pregnancy; GBS bacteriuria; Supervision of high risk pregnancy, antepartum, third trimester; Sterilization consult; Gestational diabetes mellitus (GDM) controlled on oral hypoglycemic drug, antepartum; and LGA (large for gestational age) fetus affecting management of mother on their problem list.  Patient reports cramping.  Contractions: Not present. Vag. Bleeding: None.  Movement: Present. Denies leaking of fluid.   The following portions of the patient's history were reviewed and updated as appropriate: allergies, current medications, past family history, past medical history, past social history, past surgical history and problem list.   Objective:   Vitals:   11/25/22 1303  BP: 103/78  Pulse: 89  Weight: 265 lb (120.2 kg)    Fetal Status: Fetal Heart Rate (bpm): 147 Fundal Height: 38 cm Movement: Present     General:  Alert, oriented and cooperative. Patient is in no acute distress.  Skin: Skin is warm and dry. No rash noted.   Cardiovascular: Normal heart rate noted  Respiratory: Normal respiratory effort, no problems with respiration noted  Abdomen: Soft, gravid, appropriate for gestational age.  Pain/Pressure: Absent     Pelvic: Cervical exam completed. 1 cm, 30%, -3  Extremities: Normal range of motion.  Edema: None  Mental Status: Normal mood and affect. Normal behavior. Normal judgment and thought content.   Assessment and Plan:  Pregnancy: G3P1011 at [redacted]w[redacted]d 1. Supervision of high risk pregnancy, antepartum, third trimester  - Culture, beta strep (group b only) - Cervicovaginal ancillary only( [place])  2. Gestational diabetes mellitus (GDM) controlled on  oral hypoglycemic drug, antepartum  Taking Metformin 500 AM and PM  No Bs log today Reports fasting BS 70-80's. 2 hour post meals are all <120's LGA:  baby 3529 grams.  MFM recommends 39 week induction or sooner if BS are not well-controlled Continue weekly testing with MFM. Bring a log next week to your visit for review. If BS are suboptimal will change induction to 38 weeks.    Preterm labor symptoms and general obstetric precautions including but not limited to vaginal bleeding, contractions, leaking of fluid and fetal movement were reviewed in detail with the patient. Please refer to After Visit Summary for other counseling recommendations.   No follow-ups on file.  Future Appointments  Date Time Provider Department Center  11/27/2022  2:45 PM [facility] US6 [facility]  12/04/2022  8:45 AM [facility] NST [facility]  12/11/2022  8:45 AM [facility] NST [facility]    Venia Carbon, NP

## 2022-11-26 LAB — CERVICOVAGINAL ANCILLARY ONLY
Chlamydia: NEGATIVE
Comment: NEGATIVE
Comment: NORMAL
Neisseria Gonorrhea: NEGATIVE

## 2022-11-27 ENCOUNTER — Ambulatory Visit: Payer: Medicaid Other | Attending: Obstetrics

## 2022-11-27 ENCOUNTER — Ambulatory Visit: Payer: Medicaid Other

## 2022-11-27 DIAGNOSIS — O98513 Other viral diseases complicating pregnancy, third trimester: Secondary | ICD-10-CM

## 2022-11-27 DIAGNOSIS — O09293 Supervision of pregnancy with other poor reproductive or obstetric history, third trimester: Secondary | ICD-10-CM | POA: Diagnosis not present

## 2022-11-27 DIAGNOSIS — O285 Abnormal chromosomal and genetic finding on antenatal screening of mother: Secondary | ICD-10-CM

## 2022-11-27 DIAGNOSIS — Z3A36 36 weeks gestation of pregnancy: Secondary | ICD-10-CM

## 2022-11-27 DIAGNOSIS — E669 Obesity, unspecified: Secondary | ICD-10-CM | POA: Diagnosis not present

## 2022-11-27 DIAGNOSIS — Z148 Genetic carrier of other disease: Secondary | ICD-10-CM | POA: Diagnosis not present

## 2022-11-27 DIAGNOSIS — O99213 Obesity complicating pregnancy, third trimester: Secondary | ICD-10-CM | POA: Diagnosis not present

## 2022-11-27 DIAGNOSIS — B009 Herpesviral infection, unspecified: Secondary | ICD-10-CM

## 2022-11-27 DIAGNOSIS — O24415 Gestational diabetes mellitus in pregnancy, controlled by oral hypoglycemic drugs: Secondary | ICD-10-CM | POA: Insufficient documentation

## 2022-11-28 DIAGNOSIS — O24419 Gestational diabetes mellitus in pregnancy, unspecified control: Secondary | ICD-10-CM | POA: Diagnosis not present

## 2022-11-28 LAB — CULTURE, BETA STREP (GROUP B ONLY): Strep Gp B Culture: POSITIVE — AB

## 2022-12-02 ENCOUNTER — Ambulatory Visit (INDEPENDENT_AMBULATORY_CARE_PROVIDER_SITE_OTHER): Payer: Medicaid Other | Admitting: Obstetrics and Gynecology

## 2022-12-02 VITALS — BP 104/71 | HR 90 | Wt 263.0 lb

## 2022-12-02 DIAGNOSIS — O0993 Supervision of high risk pregnancy, unspecified, third trimester: Secondary | ICD-10-CM

## 2022-12-02 NOTE — Progress Notes (Signed)
   PRENATAL VISIT NOTE  Subjective:  Sarah Villanueva is a 32 y.o. G3P1011 at [redacted]w[redacted]d being seen today for ongoing prenatal care.  She is currently monitored for the following issues for this low-risk pregnancy and has BMI 32.0-32.9,adult; Not immune to rubella; Cystic fibrosis carrier; Herpes simplex infection during pregnancy; GBS bacteriuria; Supervision of high risk pregnancy, antepartum, third trimester; Sterilization consult; Gestational diabetes mellitus (GDM) controlled on oral hypoglycemic drug, antepartum; and LGA (large for gestational age) fetus affecting management of mother on their problem list.  Patient reports no complaints.  Contractions: Not present. Vag. Bleeding: None.  Movement: Present. Denies leaking of fluid.   The following portions of the patient's history were reviewed and updated as appropriate: allergies, current medications, past family history, past medical history, past social history, past surgical history and problem list.   Objective:   Vitals:   12/02/22 0814  BP: 104/71  Pulse: 90  Weight: 263 lb (119.3 kg)    Fetal Status: Fetal Heart Rate (bpm): 135 Fundal Height: 38 cm Movement: Present     General:  Alert, oriented and cooperative. Patient is in no acute distress.  Skin: Skin is warm and dry. No rash noted.   Cardiovascular: Normal heart rate noted  Respiratory: Normal respiratory effort, no problems with respiration noted  Abdomen: Soft, gravid, appropriate for gestational age.  Pain/Pressure: Absent     Pelvic: Cervical exam performed in the presence of a chaperone Dilation: 1 Effacement (%): Thick Station: -3  Extremities: Normal range of motion.  Edema: None  Mental Status: Normal mood and affect. Normal behavior. Normal judgment and thought content.   Assessment and Plan:  Pregnancy: G3P1011 at [redacted]w[redacted]d  -BS fasting: all readings less than 95 with one outlier (98) -PP readings: all readings less than 120 with one outlier (123) - continue  checking your BS at home and keeping a log until delivery. -Plan for induction at 39 weeks per MFM, last EFW 3500 grams.  -Continue weekly BPP with MFM -Continue metformin 500 mg BID -Hold metformin prior to scheduled induction.    Term labor symptoms and general obstetric precautions including but not limited to vaginal bleeding, contractions, leaking of fluid and fetal movement were reviewed in detail with the patient. Please refer to After Visit Summary for other counseling recommendations.   No follow-ups on file.  Future Appointments  Date Time Provider Department Center  12/04/2022  8:45 AM [facility] NST [facility]  12/09/2022  9:30 AM Sue Lush, FNP [facility]  12/11/2022  8:45 AM [facility] NST [facility]    Venia Carbon, NP

## 2022-12-04 ENCOUNTER — Ambulatory Visit: Payer: Medicaid Other | Attending: Obstetrics and Gynecology | Admitting: *Deleted

## 2022-12-04 DIAGNOSIS — O24415 Gestational diabetes mellitus in pregnancy, controlled by oral hypoglycemic drugs: Secondary | ICD-10-CM | POA: Insufficient documentation

## 2022-12-04 DIAGNOSIS — Z3A37 37 weeks gestation of pregnancy: Secondary | ICD-10-CM | POA: Insufficient documentation

## 2022-12-04 DIAGNOSIS — O3660X0 Maternal care for excessive fetal growth, unspecified trimester, not applicable or unspecified: Secondary | ICD-10-CM | POA: Insufficient documentation

## 2022-12-04 NOTE — Procedures (Addendum)
[name] 04-12-1990 [redacted]w[redacted]d  Fetus A Non-Stress Test Interpretation for 12/04/22 (NST only)  Indication: Gestational Diabetes medication controlled  Fetal Heart Rate A Mode: External Baseline Rate (A): 145 bpm Variability: Moderate Accelerations: 15 x 15 Decelerations: None Multiple birth?: No  Uterine Activity Mode: Palpation, Toco Contraction Frequency (min): None  Interpretation (Fetal Testing) Nonstress Test Interpretation: Reactive Comments: Dr. Judeth Cornfield reviewed tracing.

## 2022-12-09 ENCOUNTER — Encounter (HOSPITAL_COMMUNITY): Payer: Self-pay

## 2022-12-09 ENCOUNTER — Telehealth (HOSPITAL_COMMUNITY): Payer: Self-pay | Admitting: *Deleted

## 2022-12-09 ENCOUNTER — Ambulatory Visit (INDEPENDENT_AMBULATORY_CARE_PROVIDER_SITE_OTHER): Payer: Medicaid Other | Admitting: Obstetrics and Gynecology

## 2022-12-09 VITALS — BP 108/73 | HR 90 | Wt 267.0 lb

## 2022-12-09 DIAGNOSIS — Z3A38 38 weeks gestation of pregnancy: Secondary | ICD-10-CM

## 2022-12-09 DIAGNOSIS — B009 Herpesviral infection, unspecified: Secondary | ICD-10-CM

## 2022-12-09 DIAGNOSIS — O0993 Supervision of high risk pregnancy, unspecified, third trimester: Secondary | ICD-10-CM

## 2022-12-09 DIAGNOSIS — O98519 Other viral diseases complicating pregnancy, unspecified trimester: Secondary | ICD-10-CM

## 2022-12-09 DIAGNOSIS — O3663X0 Maternal care for excessive fetal growth, third trimester, not applicable or unspecified: Secondary | ICD-10-CM

## 2022-12-09 DIAGNOSIS — O24415 Gestational diabetes mellitus in pregnancy, controlled by oral hypoglycemic drugs: Secondary | ICD-10-CM

## 2022-12-09 NOTE — Telephone Encounter (Signed)
Preadmission screen  

## 2022-12-09 NOTE — Progress Notes (Signed)
   PRENATAL VISIT NOTE  Subjective:  Sarah Villanueva is a 32 y.o. G3P1011 at [redacted]w[redacted]d being seen today for ongoing prenatal care.  She is currently monitored for the following issues for this high-risk pregnancy and has BMI 32.0-32.9,adult; Not immune to rubella; Cystic fibrosis carrier; Herpes simplex infection during pregnancy; GBS bacteriuria; Supervision of high risk pregnancy, antepartum, third trimester; Sterilization consult; Gestational diabetes mellitus (GDM) controlled on oral hypoglycemic drug, antepartum; and LGA (large for gestational age) fetus affecting management of mother on their problem list.  Patient reports no complaints.  Contractions: Not present. Vag. Bleeding: None.  Movement: Present. Denies leaking of fluid.   The following portions of the patient's history were reviewed and updated as appropriate: allergies, current medications, past family history, past medical history, past social history, past surgical history and problem list.   Objective:   Vitals:   12/09/22 0814  BP: 108/73  Pulse: 90  Weight: 267 lb (121.1 kg)    Fetal Status: Fetal Heart Rate (bpm): 146   Movement: Present     General:  Alert, oriented and cooperative. Patient is in no acute distress.  Skin: Skin is warm and dry. No rash noted.   Cardiovascular: Normal heart rate noted  Respiratory: Normal respiratory effort, no problems with respiration noted  Abdomen: Soft, gravid, appropriate for gestational age.  Pain/Pressure: Absent     Pelvic: Cervical exam deferred        Extremities: Normal range of motion.  Edema: None  Mental Status: Normal mood and affect. Normal behavior. Normal judgment and thought content.   Assessment and Plan:  Pregnancy: G3P1011 at [redacted]w[redacted]d 1. Supervision of high risk pregnancy, antepartum, third trimester BP and FHR normal Feeling regular fetal movement  2. Gestational diabetes mellitus (GDM) controlled on oral hypoglycemic drug, antepartum On Metformin 500mg   BID Reports <95 fasting, <120s pp with a couple of outliers  3. Excessive fetal growth affecting management of pregnancy in third trimester, single or unspecified fetus 8/15 u/s EFW >99%, AC >99%, afi nml,  8/22 BPP 8/8 NST scheduled 9/5 Previous child weighed 9lb 9oz  IOL scheduled 9/8  4. Herpes simplex infection in mother during pregnancy, antepartum On valtrex  5. [redacted] weeks gestation of pregnancy Discussed iol   Term labor symptoms and general obstetric precautions including but not limited to vaginal bleeding, contractions, leaking of fluid and fetal movement were reviewed in detail with the patient. Please refer to After Visit Summary for other counseling recommendations.   Future Appointments  Date Time Provider Department Center  12/09/2022  9:30 AM Sue Lush, FNP [facility]  12/11/2022  8:45 AM [facility] NST [facility]  12/14/2022  6:30 AM [facility] SCHED ROOM [facility] None    Albertine Grates, FNP

## 2022-12-10 ENCOUNTER — Encounter (HOSPITAL_COMMUNITY): Payer: Self-pay | Admitting: *Deleted

## 2022-12-10 ENCOUNTER — Telehealth (HOSPITAL_COMMUNITY): Payer: Self-pay | Admitting: *Deleted

## 2022-12-10 NOTE — Telephone Encounter (Signed)
Preadmission screen  

## 2022-12-11 ENCOUNTER — Ambulatory Visit: Payer: Medicaid Other | Attending: Obstetrics

## 2022-12-12 ENCOUNTER — Other Ambulatory Visit: Payer: Self-pay | Admitting: Advanced Practice Midwife

## 2022-12-12 DIAGNOSIS — O24410 Gestational diabetes mellitus in pregnancy, diet controlled: Secondary | ICD-10-CM

## 2022-12-14 ENCOUNTER — Inpatient Hospital Stay (HOSPITAL_COMMUNITY): Payer: Medicaid Other | Admitting: Anesthesiology

## 2022-12-14 ENCOUNTER — Encounter (HOSPITAL_COMMUNITY): Payer: Self-pay | Admitting: Obstetrics & Gynecology

## 2022-12-14 ENCOUNTER — Other Ambulatory Visit: Payer: Self-pay

## 2022-12-14 ENCOUNTER — Inpatient Hospital Stay (HOSPITAL_COMMUNITY): Payer: Medicaid Other

## 2022-12-14 ENCOUNTER — Inpatient Hospital Stay (HOSPITAL_COMMUNITY)
Admission: RE | Admit: 2022-12-14 | Discharge: 2022-12-16 | DRG: 806 | Disposition: A | Discharge status: Home / Self Care | Payer: Medicaid Other | Attending: Family Medicine | Admitting: Family Medicine

## 2022-12-14 DIAGNOSIS — Z8759 Personal history of other complications of pregnancy, childbirth and the puerperium: Secondary | ICD-10-CM

## 2022-12-14 DIAGNOSIS — A6000 Herpesviral infection of urogenital system, unspecified: Secondary | ICD-10-CM | POA: Diagnosis not present

## 2022-12-14 DIAGNOSIS — O9832 Other infections with a predominantly sexual mode of transmission complicating childbirth: Secondary | ICD-10-CM | POA: Diagnosis present

## 2022-12-14 DIAGNOSIS — O24410 Gestational diabetes mellitus in pregnancy, diet controlled: Principal | ICD-10-CM | POA: Diagnosis present

## 2022-12-14 DIAGNOSIS — O99824 Streptococcus B carrier state complicating childbirth: Secondary | ICD-10-CM | POA: Diagnosis present

## 2022-12-14 DIAGNOSIS — Z141 Cystic fibrosis carrier: Secondary | ICD-10-CM

## 2022-12-14 DIAGNOSIS — O3663X0 Maternal care for excessive fetal growth, third trimester, not applicable or unspecified: Secondary | ICD-10-CM | POA: Diagnosis present

## 2022-12-14 DIAGNOSIS — O98519 Other viral diseases complicating pregnancy, unspecified trimester: Secondary | ICD-10-CM | POA: Diagnosis present

## 2022-12-14 DIAGNOSIS — O24425 Gestational diabetes mellitus in childbirth, controlled by oral hypoglycemic drugs: Principal | ICD-10-CM | POA: Diagnosis present

## 2022-12-14 DIAGNOSIS — R8271 Bacteriuria: Secondary | ICD-10-CM | POA: Diagnosis present

## 2022-12-14 DIAGNOSIS — B009 Herpesviral infection, unspecified: Secondary | ICD-10-CM | POA: Diagnosis present

## 2022-12-14 DIAGNOSIS — O99214 Obesity complicating childbirth: Secondary | ICD-10-CM | POA: Diagnosis not present

## 2022-12-14 DIAGNOSIS — Z789 Other specified health status: Secondary | ICD-10-CM | POA: Diagnosis present

## 2022-12-14 DIAGNOSIS — Z3A39 39 weeks gestation of pregnancy: Secondary | ICD-10-CM

## 2022-12-14 DIAGNOSIS — O3660X0 Maternal care for excessive fetal growth, unspecified trimester, not applicable or unspecified: Secondary | ICD-10-CM | POA: Diagnosis present

## 2022-12-14 DIAGNOSIS — Z833 Family history of diabetes mellitus: Secondary | ICD-10-CM

## 2022-12-14 DIAGNOSIS — O24424 Gestational diabetes mellitus in childbirth, insulin controlled: Secondary | ICD-10-CM | POA: Diagnosis not present

## 2022-12-14 DIAGNOSIS — Z8249 Family history of ischemic heart disease and other diseases of the circulatory system: Secondary | ICD-10-CM | POA: Diagnosis not present

## 2022-12-14 DIAGNOSIS — O0993 Supervision of high risk pregnancy, unspecified, third trimester: Secondary | ICD-10-CM

## 2022-12-14 DIAGNOSIS — O99820 Streptococcus B carrier state complicating pregnancy: Secondary | ICD-10-CM | POA: Diagnosis not present

## 2022-12-14 LAB — TYPE AND SCREEN
ABO/RH(D): A POS
Antibody Screen: NEGATIVE

## 2022-12-14 LAB — CBC
HCT: 32.4 % — ABNORMAL LOW (ref 36.0–46.0)
Hemoglobin: 10.2 g/dL — ABNORMAL LOW (ref 12.0–15.0)
MCH: 25.2 pg — ABNORMAL LOW (ref 26.0–34.0)
MCHC: 31.5 g/dL (ref 30.0–36.0)
MCV: 80 fL (ref 80.0–100.0)
Platelets: 307 10*3/uL (ref 150–400)
RBC: 4.05 MIL/uL (ref 3.87–5.11)
RDW: 13.8 % (ref 11.5–15.5)
WBC: 9.1 10*3/uL (ref 4.0–10.5)
nRBC: 0 % (ref 0.0–0.2)

## 2022-12-14 LAB — GLUCOSE, CAPILLARY
Glucose-Capillary: 141 mg/dL — ABNORMAL HIGH (ref 70–99)
Glucose-Capillary: 76 mg/dL (ref 70–99)
Glucose-Capillary: 78 mg/dL (ref 70–99)
Glucose-Capillary: 89 mg/dL (ref 70–99)
Glucose-Capillary: 93 mg/dL (ref 70–99)

## 2022-12-14 LAB — RPR: RPR Ser Ql: NONREACTIVE

## 2022-12-14 MED ORDER — LACTATED RINGERS IV SOLN
500.0000 mL | INTRAVENOUS | Status: DC | PRN
  Administered 2022-12-14 – 2022-12-15 (×3): 500 mL via INTRAVENOUS

## 2022-12-14 MED ORDER — FENTANYL CITRATE (PF) 100 MCG/2ML IJ SOLN: 100.0000 ug | INTRAMUSCULAR | Status: DC | PRN

## 2022-12-14 MED ORDER — OXYCODONE-ACETAMINOPHEN 5-325 MG PO TABS: 1.0000 | ORAL_TABLET | ORAL | Status: DC | PRN

## 2022-12-14 MED ORDER — TERBUTALINE SULFATE 1 MG/ML IJ SOLN: 0.2500 mg | Freq: Once | INTRAMUSCULAR | Status: DC | PRN

## 2022-12-14 MED ORDER — LACTATED RINGERS IV SOLN: INTRAVENOUS | Status: DC

## 2022-12-14 MED ORDER — PENICILLIN G POT IN DEXTROSE 60000 UNIT/ML IV SOLN
3.0000 10*6.[IU] | INTRAVENOUS | Status: DC
  Administered 2022-12-14 – 2022-12-15 (×6): 3 10*6.[IU] via INTRAVENOUS
  Filled 2022-12-14 (×6): qty 50

## 2022-12-14 MED ORDER — LIDOCAINE HCL (PF) 1 % IJ SOLN: 30.0000 mL | INTRAMUSCULAR | Status: DC | PRN

## 2022-12-14 MED ORDER — METFORMIN HCL 500 MG PO TABS
500.0000 mg | ORAL_TABLET | Freq: Two times a day (BID) | ORAL | Status: DC
  Administered 2022-12-14 – 2022-12-15 (×2): 500 mg via ORAL
  Filled 2022-12-14 (×3): qty 1

## 2022-12-14 MED ORDER — ONDANSETRON HCL 4 MG/2ML IJ SOLN: 4.0000 mg | Freq: Four times a day (QID) | INTRAMUSCULAR | Status: DC | PRN

## 2022-12-14 MED ORDER — MISOPROSTOL 50MCG HALF TABLET
50.0000 ug | ORAL_TABLET | Freq: Once | ORAL | Status: AC
  Administered 2022-12-14: 50 ug via ORAL
  Filled 2022-12-14: qty 1

## 2022-12-14 MED ORDER — OXYCODONE-ACETAMINOPHEN 5-325 MG PO TABS: 2.0000 | ORAL_TABLET | ORAL | Status: DC | PRN

## 2022-12-14 MED ORDER — VALACYCLOVIR HCL 500 MG PO TABS
500.0000 mg | ORAL_TABLET | Freq: Two times a day (BID) | ORAL | Status: DC
  Administered 2022-12-14 – 2022-12-15 (×3): 500 mg via ORAL
  Filled 2022-12-14 (×3): qty 1

## 2022-12-14 MED ORDER — OXYTOCIN-SODIUM CHLORIDE 30-0.9 UT/500ML-% IV SOLN
1.0000 m[IU]/min | INTRAVENOUS | Status: DC
  Administered 2022-12-14: 2 m[IU]/min via INTRAVENOUS
  Filled 2022-12-14: qty 500

## 2022-12-14 MED ORDER — SOD CITRATE-CITRIC ACID 500-334 MG/5ML PO SOLN: 30.0000 mL | ORAL | Status: DC | PRN

## 2022-12-14 MED ORDER — SODIUM CHLORIDE 0.9 % IV SOLN
5.0000 10*6.[IU] | Freq: Once | INTRAVENOUS | Status: AC
  Administered 2022-12-14: 5 10*6.[IU] via INTRAVENOUS
  Filled 2022-12-14: qty 5

## 2022-12-14 MED ORDER — PHENYLEPHRINE 80 MCG/ML (10ML) SYRINGE FOR IV PUSH (FOR BLOOD PRESSURE SUPPORT)
80.0000 ug | PREFILLED_SYRINGE | INTRAVENOUS | Status: DC | PRN
  Filled 2022-12-14: qty 10

## 2022-12-14 MED ORDER — PHENYLEPHRINE 80 MCG/ML (10ML) SYRINGE FOR IV PUSH (FOR BLOOD PRESSURE SUPPORT): 80.0000 ug | PREFILLED_SYRINGE | INTRAVENOUS | Status: DC | PRN

## 2022-12-14 MED ORDER — ACETAMINOPHEN 325 MG PO TABS: 650.0000 mg | ORAL_TABLET | ORAL | Status: DC | PRN

## 2022-12-14 MED ORDER — EPHEDRINE 5 MG/ML INJ: 10.0000 mg | INTRAVENOUS | Status: DC | PRN

## 2022-12-14 MED ORDER — LACTATED RINGERS IV SOLN
500.0000 mL | Freq: Once | INTRAVENOUS | Status: AC
  Administered 2022-12-14: 500 mL via INTRAVENOUS

## 2022-12-14 MED ORDER — OXYTOCIN BOLUS FROM INFUSION
333.0000 mL | Freq: Once | INTRAVENOUS | Status: AC
  Administered 2022-12-15: 333 mL via INTRAVENOUS

## 2022-12-14 MED ORDER — LIDOCAINE-EPINEPHRINE (PF) 2 %-1:200000 IJ SOLN
INTRAMUSCULAR | Status: DC | PRN
  Administered 2022-12-14: 5 mL via EPIDURAL

## 2022-12-14 MED ORDER — DIPHENHYDRAMINE HCL 50 MG/ML IJ SOLN: 12.5000 mg | INTRAMUSCULAR | Status: DC | PRN

## 2022-12-14 MED ORDER — MISOPROSTOL 25 MCG QUARTER TABLET
25.0000 ug | ORAL_TABLET | Freq: Once | ORAL | Status: AC
  Administered 2022-12-14: 25 ug via VAGINAL
  Filled 2022-12-14: qty 1

## 2022-12-14 MED ORDER — FENTANYL-BUPIVACAINE-NACL 0.5-0.125-0.9 MG/250ML-% EP SOLN
12.0000 mL/h | EPIDURAL | Status: DC | PRN
  Administered 2022-12-14: 12 mL/h via EPIDURAL
  Filled 2022-12-14: qty 250

## 2022-12-14 MED ORDER — OXYTOCIN-SODIUM CHLORIDE 30-0.9 UT/500ML-% IV SOLN
2.5000 [IU]/h | INTRAVENOUS | Status: DC
  Filled 2022-12-14: qty 500

## 2022-12-14 NOTE — H&P (Signed)
LABOR AND DELIVERY ADMISSION HISTORY AND PHYSICAL NOTE  Sarah Villanueva is a 32 y.o. female G10P1011 with IUP at [redacted]w[redacted]d presenting for IOL for a2GDM on metformin. Pregnancy c/b hx HSV, LGA.   Patient reports the fetal movement as active. Patient reports uterine contraction activity as none. Patient reports vaginal bleeding as none. Patient describes fluid per vagina as None.   Patient denies headache, vision changes, chest pain, shortness of breath, right upper quadrant pain, or LE edema.  She plans on breast feeding feeding. Her contraception plan is:  vasectomy or BLT if C/S .  Prenatal History/Complications: PNC at [facility]  Sono:  @[redacted]w[redacted]d , CWD, normal anatomy, cephalic presentation, anterior fundal placenta, >99%ile, BPD >99%, AC >99%  Pregnancy complications:  Patient Active Problem List   Diagnosis Date Noted   GDM, class A1 12/14/2022   LGA (large for gestational age) fetus affecting management of mother 10/20/2022   Gestational diabetes mellitus (GDM) controlled on oral hypoglycemic drug, antepartum 10/09/2022   Sterilization consult 07/31/2022   Supervision of high risk pregnancy, antepartum, third trimester 07/28/2022   GBS bacteriuria 06/04/2022   Herpes simplex infection during pregnancy 03/07/2019   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 No pertinent surgical history.  Obstetrical History: OB History     Gravida  3   Para  1   Term  1   Preterm      AB  1   Living  1      SAB      IAB  1   Ectopic      Multiple  0   Live Births  1           Social History: Social History   Socioeconomic History   Marital status: Single    Spouse  name: Not on file   Number of children: Not on file   Years of education: Not on file   Highest education level: Not on file  Occupational History   Not on file  Tobacco Use   Smoking status: Never   Smokeless tobacco: Never  Vaping Use   Vaping status: Never Used  Substance and Sexual Activity   Alcohol use: Not Currently    Comment: occ   Drug use: Never   Sexual activity: Yes    Birth control/protection: None  Other Topics Concern   Not on file  Social History Narrative   Not on file   Social Determinants of Health   Financial Resource Strain: Not on file  Food Insecurity: No Food Insecurity (10/09/2022)   Hunger Vital Sign    Worried About Running Out of Food in the Last Year: Never true    Ran Out of Food in the Last Year: Never true  Transportation Needs: Not on file  Physical Activity: Not on file  Stress: Not on file  Social Connections: Not on file    Family History: Family History  Problem Relation Age of Onset   Cervical cancer Mother    Diabetes Father    Hypertension Father    Hypertension Paternal Grandmother    Diabetes Paternal Grandmother    Hypertension Paternal Actor  Diabetes Paternal Grandfather    Colon cancer Neg Hx     Allergies: No Known Allergies  Medications Prior to Admission  Medication Sig Dispense Refill Last Dose   ferrous sulfate 325 (65 FE) MG tablet Take 1 tablet (325 mg total) by mouth every other day. 90 tablet 1    metFORMIN (GLUCOPHAGE) 500 MG tablet Take 1 tablet (500 mg total) by mouth 2 (two) times daily with a meal. 60 tablet 5    Prenatal Vit-Fe Fumarate-FA (PRENATAL MULTIVITAMIN) TABS tablet Take 1 tablet by mouth daily at 12 noon.      valACYclovir (VALTREX) 500 MG tablet Take 1 tablet (500 mg total) by mouth 2 (two) times daily. 60 tablet 1      Review of Systems  All systems reviewed and negative except as stated in HPI  Physical Exam BP 111/72   Pulse 87   Temp 98.4 F (36.9 C) (Oral)   Resp 16    LMP 03/15/2022   SpO2 98%   Physical Exam Constitutional:      General: She is not in acute distress.    Appearance: She is obese.  Cardiovascular:     Rate and Rhythm: Normal rate and regular rhythm.  Pulmonary:     Effort: Pulmonary effort is normal.  Abdominal:     Comments: Gravid  Genitourinary:    Comments: SSE showed no lesions Neurological:     Mental Status: Mental status is at baseline.  Psychiatric:        Mood and Affect: Mood normal.   Presentation: cephalic by check  Fetal monitoring: Baseline: 140 bpm, Variability: Good {> 6 bpm), Accelerations: Reactive, and Decelerations: none Uterine activity: rare  Dilation: 1.5 Effacement (%): 50 Station: -3 Presentation: Vertex Exam by:: Dr. Earlene Plater  Prenatal labs: ABO, Rh: --/--/A POS (09/08 0915) Antibody: NEG (09/08 0915) Rubella: <0.90 (02/19 1539) RPR: NON REACTIVE (09/08 0902)  HBsAg: Negative (02/19 1539)  HIV: Non Reactive (06/20 0856)  GC/Chlamydia:  Neisseria Gonorrhea  Date Value Ref Range Status  11/25/2022 Negative  Final   Chlamydia  Date Value Ref Range Status  11/25/2022 Negative  Final   GBS: Positive/-- (08/20 1304)    Prenatal Transfer Tool  Maternal Diabetes: Yes:  Diabetes Type:  Insulin/Medication controlled Genetic Screening: Declined Maternal Ultrasounds/Referrals: Normal Fetal Ultrasounds or other Referrals:  None Maternal Substance Abuse:  No Significant Maternal Medications:  Meds include: Other: metformin Significant Maternal Lab Results: Group B Strep positive  Results for orders placed or performed during the hospital encounter of 12/14/22 (from the past 24 hour(s))  CBC   Collection Time: 12/14/22  9:02 AM  Result Value Ref Range   WBC 9.1 4.0 - 10.5 K/uL   RBC 4.05 3.87 - 5.11 MIL/uL   Hemoglobin 10.2 (L) 12.0 - 15.0 g/dL   HCT 62.9 (L) 52.8 - 41.3 %   MCV 80.0 80.0 - 100.0 fL   MCH 25.2 (L) 26.0 - 34.0 pg   MCHC 31.5 30.0 - 36.0 g/dL   RDW 24.4 01.0 - 27.2 %    Platelets 307 150 - 400 K/uL   nRBC 0.0 0.0 - 0.2 %  RPR   Collection Time: 12/14/22  9:02 AM  Result Value Ref Range   RPR Ser Ql NON REACTIVE NON REACTIVE  Type and screen   Collection Time: 12/14/22  9:15 AM  Result Value Ref Range   ABO/RH(D) A POS    Antibody Screen NEG    Sample Expiration  12/17/2022,2359 Performed at [facility], [address] [place], [place] [address]   Glucose, capillary   Collection Time: 12/14/22  9:36 AM  Result Value Ref Range   Glucose-Capillary 141 (H) 70 - 99 mg/dL   Comment 1 Notify RN     Assessment: Carollee Mumma is a 32 y.o. G3P1011 at [redacted]w[redacted]d here for IOL 2/2 well-controlled A2GDM.  #Labor: Start with Cytotec. Patient would like to avoid balloon. Plan for pti/AROM when able #Pain: IV pain meds PRN, epidural upon request #FHT: Category I #GBS/ID: Positive #MOF: breast feeding #MOC:  vasectomy or BTL if C/S  #A2GDM: continue metformin. Q4 latent/Q2 active glu #Hx HSV: denies prodromal symptoms. SSE without lesions  Joanne Gavel, MD [facility] Fellow Center for [facility], [facility]  12/14/2022, 12:59 PM

## 2022-12-14 NOTE — Anesthesia Preprocedure Evaluation (Signed)
Anesthesia Evaluation  Patient identified by MRN, date of birth, ID band Patient awake    Reviewed: Allergy & Precautions, NPO status , Patient's Chart, lab work & pertinent test results  Airway Mallampati: III  TM Distance: >3 FB Neck ROM: Full    Dental no notable dental hx.    Pulmonary neg pulmonary ROS   Pulmonary exam normal breath sounds clear to auscultation       Cardiovascular negative cardio ROS Normal cardiovascular exam Rhythm:Regular Rate:Normal     Neuro/Psych negative neurological ROS  negative psych ROS   GI/Hepatic negative GI ROS, Neg liver ROS,,,  Endo/Other  diabetes, Gestational, Oral Hypoglycemic Agents  Obese BMI 37  Renal/GU negative Renal ROS  negative genitourinary   Musculoskeletal negative musculoskeletal ROS (+)    Abdominal   Peds  Hematology negative hematology ROS (+)   Anesthesia Other Findings IOL for gDM  Reproductive/Obstetrics (+) Pregnancy                             Anesthesia Physical Anesthesia Plan  ASA: 3  Anesthesia Plan: Epidural   Post-op Pain Management:    Induction:   PONV Risk Score and Plan: Treatment may vary due to age or medical condition  Airway Management Planned: Natural Airway  Additional Equipment:   Intra-op Plan:   Post-operative Plan:   Informed Consent: I have reviewed the patients History and Physical, chart, labs and discussed the procedure including the risks, benefits and alternatives for the proposed anesthesia with the patient or authorized representative who has indicated his/her understanding and acceptance.       Plan Discussed with: Anesthesiologist  Anesthesia Plan Comments: (Patient identified. Risks, benefits, options discussed with patient including but not limited to bleeding, infection, nerve damage, paralysis, failed block, incomplete pain control, headache, blood pressure changes, nausea,  vomiting, reactions to medication, itching, and post partum back pain. Confirmed with bedside nurse the patient's most recent platelet count. Confirmed with the patient that they are not taking any anticoagulation, have any bleeding history or any family history of bleeding disorders. Patient expressed understanding and wishes to proceed. All questions were answered. )       Anesthesia Quick Evaluation

## 2022-12-14 NOTE — Progress Notes (Signed)
Labor Progress Note Sarah Villanueva is a 32 y.o. G3P1011 at [redacted]w[redacted]d presented for IOL for a2GDM on metformin.  S: Patient is doing well. No complaints at this time. Agreeable to start pitocin.  O:  BP 117/70   Pulse 86   Temp 98.4 F (36.9 C) (Oral)   Resp 16   LMP 03/15/2022   SpO2 98%   CVE: Dilation: 2 Effacement (%): 60 Station: -3 Presentation: Vertex Exam by:: Dr. Earlene Plater   A&P: 32 y.o. [id number] [redacted]w[redacted]d here for IOL for a2GDM on metformin. #Labor: Starting to progress. S/p PCN x 2. Starting pitocin to help with progression of labor. #Pain: IV pain meds PRN, epidural upon request #FWB: CAT1 #GBS positive #A2GDM continue metformin. Q4 latent/Q2 active glu #Hx HSV: denies prodromal symptoms. SSE without lesions.  Whitman Hero, Medical Student 4:45 PM

## 2022-12-14 NOTE — Anesthesia Procedure Notes (Signed)
Epidural Patient location during procedure: OB Start time: 12/14/2022 9:30 PM End time: 12/14/2022 9:40 PM  Staffing Anesthesiologist: Elmer Picker, MD Performed: anesthesiologist   Preanesthetic Checklist Completed: patient identified, IV checked, risks and benefits discussed, monitors and equipment checked, pre-op evaluation and timeout performed  Epidural Patient position: sitting Prep: DuraPrep and site prepped and draped Patient monitoring: continuous pulse ox, blood pressure, heart rate and cardiac monitor Approach: midline Location: L3-L4 Injection technique: LOR air  Needle:  Needle type: Tuohy  Needle gauge: 17 G Needle length: 9 cm Needle insertion depth: 7 cm Catheter type: closed end flexible Catheter size: 19 Gauge Catheter at skin depth: 12 cm Test dose: negative  Assessment Sensory level: T8 Events: blood not aspirated, no cerebrospinal fluid, injection not painful, no injection resistance, no paresthesia and negative IV test  Additional Notes Patient identified. Risks/Benefits/Options discussed with patient including but not limited to bleeding, infection, nerve damage, paralysis, failed block, incomplete pain control, headache, blood pressure changes, nausea, vomiting, reactions to medication both or allergic, itching and postpartum back pain. Confirmed with bedside nurse the patient's most recent platelet count. Confirmed with patient that they are not currently taking any anticoagulation, have any bleeding history or any family history of bleeding disorders. Patient expressed understanding and wished to proceed. All questions were answered. Sterile technique was used throughout the entire procedure. Please see nursing notes for vital signs. Test dose was given through epidural catheter and negative prior to continuing to dose epidural or start infusion. Warning signs of high block given to the patient including shortness of breath, tingling/numbness in hands,  complete motor block, or any concerning symptoms with instructions to call for help. Patient was given instructions on fall risk and not to get out of bed. All questions and concerns addressed with instructions to call with any issues or inadequate analgesia.  Reason for block:procedure for pain

## 2022-12-14 NOTE — Progress Notes (Signed)
Labor Progress Note Sarah Villanueva is a 31 y.o. G3P1011 at [redacted]w[redacted]d presented for IOL for A2GDM on metformin.  S: Patient is doing well. No concerns at this time  O:  BP 120/73   Pulse 66   Temp 98.7 F (37.1 C) (Oral)   Resp 16   Ht 5\' 11"  (1.803 m)   Wt 120.3 kg   LMP 03/15/2022   SpO2 98%   BMI 36.99 kg/m   CVE: Dilation: 4 Effacement (%): 50 Station: -2 Presentation: Vertex Exam by:: Ralph Leyden, MD   A&P: 32 y.o. [id number] [redacted]w[redacted]d here for IOL for a2GDM on metformin. #Labor: Progressing well  consents to AROM, which was performed by Dr. Ralph Leyden with clear fluid  fetal head well applied  cont uptitrating pitocin #Pain: IV pain meds PRN, epidural upon request #FWB: CAT1 #GBS positive #A2GDM continue metformin. Q4 latent/Q2 active glu #Hx HSV: denies prodromal symptoms. SSE without lesions.  Sundra Aland, MD 9:34 PM

## 2022-12-15 ENCOUNTER — Encounter (HOSPITAL_COMMUNITY): Payer: Self-pay | Admitting: Obstetrics & Gynecology

## 2022-12-15 DIAGNOSIS — O3663X0 Maternal care for excessive fetal growth, third trimester, not applicable or unspecified: Secondary | ICD-10-CM | POA: Diagnosis not present

## 2022-12-15 DIAGNOSIS — O99820 Streptococcus B carrier state complicating pregnancy: Secondary | ICD-10-CM | POA: Diagnosis not present

## 2022-12-15 DIAGNOSIS — O9832 Other infections with a predominantly sexual mode of transmission complicating childbirth: Secondary | ICD-10-CM | POA: Diagnosis not present

## 2022-12-15 DIAGNOSIS — O24424 Gestational diabetes mellitus in childbirth, insulin controlled: Secondary | ICD-10-CM | POA: Diagnosis not present

## 2022-12-15 DIAGNOSIS — Z8759 Personal history of other complications of pregnancy, childbirth and the puerperium: Secondary | ICD-10-CM

## 2022-12-15 DIAGNOSIS — Z3A39 39 weeks gestation of pregnancy: Secondary | ICD-10-CM | POA: Diagnosis not present

## 2022-12-15 LAB — GLUCOSE, CAPILLARY
Glucose-Capillary: 82 mg/dL (ref 70–99)
Glucose-Capillary: 90 mg/dL (ref 70–99)
Glucose-Capillary: 73 mg/dL (ref 70–99)
Glucose-Capillary: 102 mg/dL — ABNORMAL HIGH (ref 70–99)

## 2022-12-15 MED ORDER — ACETAMINOPHEN 325 MG PO TABS: 650.0000 mg | ORAL_TABLET | ORAL | Status: DC | PRN

## 2022-12-15 MED ORDER — IBUPROFEN 600 MG PO TABS
600.0000 mg | ORAL_TABLET | Freq: Four times a day (QID) | ORAL | Status: DC
  Filled 2022-12-15 (×2): qty 1

## 2022-12-15 MED ORDER — ONDANSETRON HCL 4 MG PO TABS: 4.0000 mg | ORAL_TABLET | ORAL | Status: DC | PRN

## 2022-12-15 MED ORDER — SIMETHICONE 80 MG PO CHEW: 80.0000 mg | CHEWABLE_TABLET | ORAL | Status: DC | PRN

## 2022-12-15 MED ORDER — ACETAMINOPHEN 160 MG/5ML PO SOLN: 650.0000 mg | ORAL | Status: DC | PRN

## 2022-12-15 MED ORDER — BENZOCAINE-MENTHOL 20-0.5 % EX AERO
1.0000 | INHALATION_SPRAY | CUTANEOUS | Status: DC | PRN
  Filled 2022-12-15: qty 56

## 2022-12-15 MED ORDER — SENNOSIDES-DOCUSATE SODIUM 8.6-50 MG PO TABS
2.0000 | ORAL_TABLET | ORAL | Status: DC
  Administered 2022-12-16: 2 via ORAL
  Filled 2022-12-15: qty 2

## 2022-12-15 MED ORDER — ONDANSETRON HCL 4 MG/2ML IJ SOLN: 4.0000 mg | INTRAMUSCULAR | Status: DC | PRN

## 2022-12-15 MED ORDER — IBUPROFEN 100 MG/5ML PO SUSP
600.0000 mg | Freq: Four times a day (QID) | ORAL | Status: DC
  Administered 2022-12-15 – 2022-12-16 (×2): 600 mg via ORAL
  Filled 2022-12-15 (×3): qty 30

## 2022-12-15 MED ORDER — DIBUCAINE (PERIANAL) 1 % EX OINT: 1.0000 | TOPICAL_OINTMENT | CUTANEOUS | Status: DC | PRN

## 2022-12-15 MED ORDER — DIPHENHYDRAMINE HCL 25 MG PO CAPS: 25.0000 mg | ORAL_CAPSULE | Freq: Four times a day (QID) | ORAL | Status: DC | PRN

## 2022-12-15 MED ORDER — LACTATED RINGERS AMNIOINFUSION: INTRAVENOUS | Status: DC

## 2022-12-15 MED ORDER — PRENATAL MULTIVITAMIN CH
1.0000 | ORAL_TABLET | Freq: Every day | ORAL | Status: DC
  Filled 2022-12-15: qty 1

## 2022-12-15 MED ORDER — COCONUT OIL OIL: 1.0000 | TOPICAL_OIL | Status: DC | PRN

## 2022-12-15 MED ORDER — TETANUS-DIPHTH-ACELL PERTUSSIS 5-2.5-18.5 LF-MCG/0.5 IM SUSY: 0.5000 mL | PREFILLED_SYRINGE | Freq: Once | INTRAMUSCULAR | Status: DC

## 2022-12-15 MED ORDER — WITCH HAZEL-GLYCERIN EX PADS: 1.0000 | MEDICATED_PAD | CUTANEOUS | Status: DC | PRN

## 2022-12-15 NOTE — Progress Notes (Signed)
Labor Progress Note Destina Mcconville is a 32 y.o. G3P1011 at [redacted]w[redacted]d presented for IOL for A2GDM on metformin.  S: Patient is doing well. No concerns at this time  O:  BP 110/66   Pulse 70   Temp 98.2 F (36.8 C) (Oral)   Resp 16   Ht 5\' 11"  (1.803 m)   Wt 120.3 kg   LMP 03/15/2022   SpO2 100%   BMI 36.99 kg/m   CVE: Dilation: 4 Effacement (%): 60, 70 Station: -2 Presentation: Vertex Exam by:: Lucianne Muss, MD  EFM: 140/mod/+a/-d  A&P: 32 y.o. [id number] [redacted]w[redacted]d here for IOL for a2GDM on metformin. #Labor: Minimal change despite pit so discussed IUPC placement w pt -- pt consented  IUPC in place to ensure adequate labor #Pain: Epidural #FWB: CAT1 #GBS positive, on PCN  #A2GDM continue metformin. Q4 latent/Q2 active glu #Hx HSV: denies prodromal symptoms. SSE without lesions.  Sundra Aland, MD 8:39 AM

## 2022-12-15 NOTE — Lactation Note (Signed)
This note was copied from a baby's chart. Lactation Consultation Note  Patient Name: Sarah Villanueva [id number] Date: 12/15/2022 Age:29 hours Reason for consult: Initial assessment;Term.  P2, term female infant, Per Birth Parent BF is going well, but Birth Parent is unsure if she wants to continue to latch infant at the breast or  just pump only. If she " decides to pump only" she wants to supplement infant with donor breast milk. LC will inform RN on Birth Parent's current feeding choice. Birth Parent has BF before see maternal data below. LC did discuss if she decided to continue to latch infant at the breast to BF every 2-3 hours within 24 hours, skin to skin. She doesn't want to use Hospital DEBP will use her Personal Motif DEBP from home, Birth Parent knows to pump every 3 hours for 15 minutes on initial setting and give infant back any EBM first before offering formula. Birth Parent will continue to feed infant by cues, every 2-3 hours, skin to skin. LC gave Birth Parent " Feeding Guidelines sheet". Birth Parent was made aware of O/P services, breastfeeding support groups, community resources, and our phone # for post-discharge questions.    Maternal Data Has patient been taught Hand Expression?: Yes Does the patient have breastfeeding experience prior to this delivery?: Yes How long did the patient breastfeed?: Per Birth Parent, she pumped  Feeding Mother's Current Feeding Choice: Breast Milk and Donor Milk (Birth Parent plans to use donor breast milk if she decided to "Pump Only")  LATCH Score  LC did not observe latch, Birth Parent is thinking about "Pumping only" and recently has hand expressed and gave sleeping infant EBM by spoon.                   Lactation Tools Discussed/Used Tools: Pump Breast pump type: Double-Electric Breast Pump Reason for Pumping: Birth parent is thinking about "Pumping Only" plans to use her Person Motiff DEBP in hospital Pumping frequency:  Birth Parent will pump every 3 hours for 15 minutes on inital setting.  Interventions Interventions: Breast feeding basics reviewed;Skin to skin;Position options;Breast compression;Education;LC Services brochure;Guidelines for Milk Supply and Pumping Schedule Handout;CDC Guidelines for Breast Pump Cleaning  Discharge Pump: DEBP;Personal (Birth Parent Brought her Motiff DEBP from home.)  Consult Status Consult Status: Follow-up Date: 12/16/22 Follow-up type: In-patient    Frederico Hamman 12/15/2022, 5:41 PM

## 2022-12-15 NOTE — Progress Notes (Addendum)
Labor Progress Note Sarah Villanueva is a 32 y.o. G3P1011 at [redacted]w[redacted]d presented for IOL for A2GDM on metformin. S: Patient is resting comfortably. Called to bedside to check fetal position.   O:  BP 129/69   Pulse 75   Temp 98.2 F (36.8 C) (Oral)   Resp 16   Ht 5\' 11"  (1.803 m)   Wt 120.3 kg   LMP 03/15/2022   SpO2 100%   BMI 36.99 kg/m  EFM: baseline 140s/min-moderate variability/+ accels, intermittent variable decels, resolved with position changes Toco: q2 min, MVU > 200 since 0700 when IUPC placed  CVE: Dilation: 4 Effacement (%): 50 Station: -2 Presentation: Vertex Exam by:: Ronnell Freshwater, RN   A&P: 32 y.o. [id number] [redacted]w[redacted]d presents for IOL of A2GDM. #Labor:  Remains in latent labor. IUPC in place, VU adequate since 0700 when IUPC placed, will hold pitocin here and work on position changes.  noted to be ROP on my cervical exam. #Pain: epidural, comfortable #FWB: cat II, overall reassuring with moderate variability and accels, continue to monitor closely #GBS positive > adequate on PCN  #A2GDM- q4 BG in latent labor, will change to q2hr when active. BG appropriate, most recent 90. EFW > 99th%ile 7lbs 12 oz at 35.4 WGA growth [place], last infant 9lsb9oz with 22 sec shoulder dystocia delivered with McRoberts and suprapubic pressure per chart review.  #HSV- negative SSE on admission.   Billey Co, MD Center for [facility], [facility] 10:26 AM

## 2022-12-15 NOTE — Progress Notes (Signed)
Labor Progress Note Jailynn Doh is a 32 y.o. G3P1011 at [redacted]w[redacted]d presented for IOL for A2GDM on metformin. S: Patient is resting comfortably. Called to bedside to check fetal position. At bedside after several variable decelerations. Position changes in place and IVF bolus started.   O:  BP 98/66   Pulse 75   Temp 98.1 F (36.7 C) (Oral)   Resp 16   Ht 5\' 11"  (1.803 m)   Wt 120.3 kg   LMP 03/15/2022   SpO2 100%   BMI 36.99 kg/m  EFM: baseline 150s/moderate variability/+ accels, intermittent variable decels, intermittent late decels, persistent with position changes Toco: q2 min, MVU > 200 since 0700 when IUPC placed  CVE: Dilation: 4 Effacement (%): 80 Station: -2 Presentation: Vertex Exam by:: Dr. Miquel Dunn   A&P: 32 y.o. [id number] [redacted]w[redacted]d presents for IOL of A2GDM. #Labor:  Remains in latent labor. IUPC in place, VU adequate since 0700 when IUPC placed, will decrease pitocin to 26 U and start amnioinfusion with 200 cc bolus and then 100 cc/hr.  #Pain: epidural, comfortable #FWB: cat II, with variable and late decels, decreasing pitocin and starting amnioinfusion as above, continue to monitor closely, continues to have moderate variability and accels #GBS positive > adequate on PCN  #A2GDM- q4 BG in latent labor, will change to q2hr when active. BG appropriate, most recent 73. EFW > 99th%ile 7lbs 12 oz at 35.4 WGA growth [place], last infant 9lsb9oz with 22 sec shoulder dystocia delivered with McRoberts and suprapubic pressure per chart review.  #HSV- negative SSE on admission.   Billey Co, MD Center for [facility], [facility] 12:40 PM

## 2022-12-15 NOTE — Discharge Summary (Shared)
Postpartum Discharge Summary  Date of Service updated     Patient Name: Sarah Villanueva DOB: 04-28-1990 MRN: [id number]  Date of admission: 12/14/2022 Delivery date:12/15/2022 Delivering provider: PRAY, Claris Che E Date of discharge: 12/15/2022  Admitting diagnosis: GDM, class A1 [O24.410] Intrauterine pregnancy: [redacted]w[redacted]d     Secondary diagnosis:  Principal Problem:   GDM, class A1 Active Problems:   Not immune to rubella   Cystic fibrosis carrier   Herpes simplex infection during pregnancy   GBS bacteriuria   Supervision of high risk pregnancy, antepartum, third trimester   LGA (large for gestational age) fetus affecting management of mother   History of shoulder dystocia in prior pregnancy  Additional problems: ***    Discharge diagnosis: Term Pregnancy Delivered and GDM A2                                              Post partum procedures:{Postpartum procedures:23558} Augmentation: AROM, Pitocin, and Cytotec Complications: {OB Labor/Delivery Complications:20784}  Hospital course: Induction of Labor With Vaginal Delivery   32 y.o. yo G3P1011 at [redacted]w[redacted]d was admitted to the hospital 12/14/2022 for induction of labor.  Indication for induction: A2 DM.  Patient had an uncomplicated labor course. Membrane Rupture Time/Date: 9:04 PM,12/14/2022  Delivery Method:Vaginal, Spontaneous Operative Delivery:N/A Episiotomy: None Lacerations:  None Details of delivery can be found in separate delivery note.  Patient had a postpartum course complicated by***. Patient is discharged home 12/15/22.  Newborn Data: Birth date:12/15/2022 Birth time:1:31 PM Gender:Female Living status:Living Apgars:8 ,9  Weight:   Magnesium Sulfate received: {Mag received:30440022} BMZ received: No Rhophylac:N/A MMR:{MMR:30440033} *** offer postpartum T-DaP:Given prenatally Flu: {WGN:56213} ** offer postpartum Transfusion:{Transfusion received:30440034}  Physical exam  Vitals:   12/15/22 1347 12/15/22 1401  12/15/22 1416 12/15/22 1431  BP: 93/69 109/70 114/67 116/72  Pulse: 76 61 60 66  Resp:      Temp:      TempSrc:      SpO2:      Weight:      Height:       General: alert, cooperative, and no distress Lochia: {Desc; appropriate/inappropriate:30686::"appropriate"} Uterine Fundus: {Desc; firm/soft:30687} Incision: {Exam; incision:21111123} DVT Evaluation: {Exam; dvt:2111122} Labs: Lab Results  Component Value Date   WBC 9.1 12/14/2022   HGB 10.2 (L) 12/14/2022   HCT 32.4 (L) 12/14/2022   MCV 80.0 12/14/2022   PLT 307 12/14/2022      Latest Ref Rng & Units 04/27/2021    4:31 PM  CMP  Glucose 70 - 99 mg/dL 96   BUN 6 - 20 mg/dL 10   Creatinine 0.86 - 1.00 mg/dL 5.78   Sodium 469 - 629 mmol/L 139   Potassium 3.5 - 5.1 mmol/L 4.0   Chloride 98 - 111 mmol/L 105   CO2 22 - 32 mmol/L 24   Calcium 8.9 - 10.3 mg/dL 9.2   Total Protein 6.5 - 8.1 g/dL 7.8   Total Bilirubin 0.3 - 1.2 mg/dL 0.6   Alkaline Phos 38 - 126 U/L 57   AST 15 - 41 U/L 22   ALT 0 - 44 U/L 22    Edinburgh Score:    08/17/2019    9:37 AM  Edinburgh Postnatal Depression Scale Screening Tool  I have been able to laugh and see the funny side of things. 0  I have looked forward with enjoyment to things. 0  I have blamed myself unnecessarily when things went wrong. 0  I have been anxious or worried for no good reason. 0  I have felt scared or panicky for no good reason. 0  Things have been getting on top of me. 0  I have been so unhappy that I have had difficulty sleeping. 0  I have felt sad or miserable. 1  I have been so unhappy that I have been crying. 0  The thought of harming myself has occurred to me. 0  Edinburgh Postnatal Depression Scale Total 1     After visit meds:  Allergies as of 12/15/2022   No Known Allergies   Med Rec must be completed prior to using this [facility]***        Discharge home in stable condition Infant Feeding: Bottle and Breast Infant Disposition:home with  mother Discharge instruction: per After Visit Summary and Postpartum booklet. Activity: Advance as tolerated. Pelvic rest for 6 weeks.  Diet: routine diet Future Appointments: Future Appointments  Date Time Provider Department Center  01/13/2023  8:30 AM Rasch, Harolyn Rutherford, NP [facility]   Follow up Visit:  Follow-up Information     [facility] for [facility] Follow up.   Specialty: Obstetrics and Gynecology Contact information: [address], [address] [place] [address] [phone]               Message sent to schedulers on 12/15/22  Please schedule this patient for a In person postpartum visit in 6 weeks with the following provider: Any provider. Additional Postpartum F/U:2 hour GTT  High risk pregnancy complicated by: GDM Delivery mode:  Vaginal, Spontaneous Anticipated Birth Control:   BTL versus vasectomy   12/15/2022 Billey Co, MD

## 2022-12-16 LAB — GLUCOSE, CAPILLARY: Glucose-Capillary: 86 mg/dL (ref 70–99)

## 2022-12-16 LAB — BIRTH TISSUE RECOVERY COLLECTION (PLACENTA DONATION)

## 2022-12-16 MED ORDER — IBUPROFEN 100 MG/5ML PO SUSP: 600.0000 mg | Freq: Four times a day (QID) | ORAL | 1 refills | Status: DC

## 2022-12-16 MED ORDER — ACETAMINOPHEN 160 MG/5ML PO SOLN: 650.0000 mg | ORAL | 0 refills | Status: DC | PRN

## 2022-12-16 NOTE — Progress Notes (Signed)
MOB was referred for history of depression/anxiety.  * Referral screened out by Clinical Social Worker because none of the following criteria appear to apply:  ~ History of anxiety/depression during this pregnancy, or of post-partum depression following prior delivery.  ~ Diagnosis of anxiety and/or depression within last 3 years  Per OB notes, MOB did not indicate any signs/symptoms during pregnancy.  OR  * MOB's symptoms currently being treated with medication and/or therapy.  Please contact the Clinical Social Worker if needs arise, by MOB request, or if MOB scores greater than 9/yes to question 10 on Edinburgh Postpartum Depression Screen.   Ashley Jones, LCSWA Clinical Social Worker [phone]  

## 2022-12-16 NOTE — Anesthesia Postprocedure Evaluation (Signed)
Anesthesia Post Note  Patient: Cherylin Timmermans  Procedure(s) Performed: AN AD HOC LABOR EPIDURAL     Patient location during evaluation: Mother Baby Anesthesia Type: Epidural Level of consciousness: awake and alert Pain management: pain level controlled Vital Signs Assessment: post-procedure vital signs reviewed and stable Respiratory status: spontaneous breathing, nonlabored ventilation and respiratory function stable Cardiovascular status: stable Postop Assessment: no headache, no backache and epidural receding Anesthetic complications: no   No notable events documented.  Last Vitals:  Vitals:   12/16/22 0142 12/16/22 0624  BP: 115/72 110/65  Pulse: 68 69  Resp: 18 18  Temp: 36.7 C 36.9 C  SpO2: 99% 99%    Last Pain:  Vitals:   12/16/22 0624  TempSrc: Oral  PainSc: 0-No pain   Pain Goal: Patients Stated Pain Goal: 2 (12/15/22 1710)                 Bary Limbach

## 2022-12-18 ENCOUNTER — Ambulatory Visit: Payer: Medicaid Other

## 2023-01-13 ENCOUNTER — Ambulatory Visit: Payer: Medicaid Other | Admitting: Obstetrics and Gynecology

## 2023-01-14 ENCOUNTER — Telehealth (HOSPITAL_COMMUNITY): Payer: Self-pay | Admitting: *Deleted

## 2023-01-14 NOTE — Telephone Encounter (Signed)
01/14/2023  Name: Sarah Villanueva MRN: [id number] DOB: 08-Jan-1991  Reason for Call:  Transition of Care Hospital Discharge Call  Contact Status: Patient Contact Status: Message  Language assistant needed:          Follow-Up Questions:    Inocente Salles Postnatal Depression Scale:  In the Past 7 Days:    PHQ2-9 Depression Scale:     Discharge Follow-up:    Post-discharge interventions: NA  Salena Saner, RN 01/14/2023 15:16

## 2023-01-27 ENCOUNTER — Ambulatory Visit: Payer: Medicaid Other | Admitting: Obstetrics and Gynecology

## 2023-02-06 ENCOUNTER — Encounter: Payer: Self-pay | Admitting: Obstetrics and Gynecology

## 2023-02-06 ENCOUNTER — Ambulatory Visit (INDEPENDENT_AMBULATORY_CARE_PROVIDER_SITE_OTHER): Payer: Medicaid Other

## 2023-02-06 ENCOUNTER — Ambulatory Visit (INDEPENDENT_AMBULATORY_CARE_PROVIDER_SITE_OTHER): Payer: Medicaid Other | Admitting: Obstetrics and Gynecology

## 2023-02-06 DIAGNOSIS — Z3A39 39 weeks gestation of pregnancy: Secondary | ICD-10-CM

## 2023-02-06 DIAGNOSIS — O24415 Gestational diabetes mellitus in pregnancy, controlled by oral hypoglycemic drugs: Secondary | ICD-10-CM

## 2023-02-06 NOTE — Progress Notes (Signed)
Post Partum Visit Note  Sarah Villanueva is a 32 y.o. G32P2012 female who presents for a postpartum visit. She is 7 weeks postpartum following a normal spontaneous vaginal delivery.  I have fully reviewed the prenatal and intrapartum course. The delivery was at 39.1 gestational weeks.  Anesthesia: epidural. Postpartum course has been unremarkable. Baby is doing well. Baby is feeding by bottle - Similac Sensitive RS. Bleeding brown. Bowel function is normal. Bladder function is normal. Patient is not sexually active. Contraception method is vasectomy. Postpartum depression screening: negative.   The pregnancy intention screening data noted above was reviewed. Potential methods of contraception were discussed. The patient elected to proceed with No data recorded.   Edinburgh Postnatal Depression Scale - 02/06/23 1026       Edinburgh Postnatal Depression Scale:  In the Past 7 Days   I have been able to laugh and see the funny side of things. 0    I have looked forward with enjoyment to things. 0    I have blamed myself unnecessarily when things went wrong. 0    I have been anxious or worried for no good reason. 0    I have felt scared or panicky for no good reason. 0    Things have been getting on top of me. 0    I have been so unhappy that I have had difficulty sleeping. 0    I have felt sad or miserable. 0    I have been so unhappy that I have been crying. 0    The thought of harming myself has occurred to me. 0    Edinburgh Postnatal Depression Scale Total 0             Health Maintenance Due  Topic Date Due   INFLUENZA VACCINE  Never done   COVID-19 Vaccine (1 - 2023-24 season) Never done    The following portions of the patient's history were reviewed and updated as appropriate: allergies, current medications, past family history, past medical history, past social history, past surgical history, and problem list.  Review of Systems Pertinent items are noted in  HPI.  Objective:  BP 98/61   Pulse (!) 53   Ht 5\' 11"  (1.803 m)   Wt 250 lb (113.4 kg)   LMP 03/15/2022   Breastfeeding No   BMI 34.87 kg/m    General:  alert and cooperative   Breasts:  not indicated  Lungs: clear to auscultation bilaterally  Heart:  regular rate and rhythm, S1, S2 normal, no murmur, click, rub or gallop       Assessment:   Normal postpartum exam.  Declines BC, partner getting vasectomy.   Plan:   Essential components of care per ACOG recommendations:  1.  Mood and well being: Patient with negative depression screening today. Reviewed local resources for support.  - Patient tobacco use? No.   - hx of drug use? No.    2. Infant care and feeding:  -Patient currently breastmilk feeding? No.  -Social determinants of health (SDOH) reviewed in EPIC. No concerns  3. Sexuality, contraception and birth spacing - Patient does not want a pregnancy in the next year.  Desired family size is 2 children.  - Reviewed reproductive life planning. Reviewed contraceptive methods based on pt preferences and effectiveness.  Patient desired Vasectomy today.   - Discussed birth spacing of 18 months  4. Sleep and fatigue -Encouraged family/partner/community support of 4 hrs of uninterrupted sleep to help with mood and  fatigue  5. Physical Recovery  - Discussed patients delivery and complications. She describes her labor as good. - Patient had a Vaginal, no problems at delivery. Patient had a  None  laceration. Perineal healing reviewed. Patient expressed understanding - Patient has urinary incontinence? No. - Patient is safe to resume physical and sexual activity  6.  Health Maintenance - HM due items addressed Yes - Last pap smear  Diagnosis  Date Value Ref Range Status  05/26/2022   Final   - Negative for intraepithelial lesion or malignancy (NILM)   Pap smear not done at today's visit.  -Breast Cancer screening indicated? No.   7. Chronic Disease/Pregnancy  Condition follow up: Gestational Diabetes - 2 hour GTT today.   - PCP follow up  Venia Carbon, NP Center for [employer], [facility]

## 2023-02-06 NOTE — Progress Notes (Signed)
Pt here for 2 hour GTT. Lab order given and pt went to lab.

## 2023-02-07 LAB — GLUCOSE TOLERANCE, 2 HOURS W/ 1HR
Glucose, 1 hour: 188 mg/dL — ABNORMAL HIGH (ref 70–179)
Glucose, 2 hour: 105 mg/dL (ref 70–152)
Glucose, Fasting: 94 mg/dL — ABNORMAL HIGH (ref 70–91)

## 2023-02-13 ENCOUNTER — Encounter: Payer: Self-pay | Admitting: Obstetrics and Gynecology

## 2023-02-13 DIAGNOSIS — E119 Type 2 diabetes mellitus without complications: Secondary | ICD-10-CM | POA: Insufficient documentation

## 2023-04-30 ENCOUNTER — Emergency Department (HOSPITAL_COMMUNITY)
Admission: EM | Admit: 2023-04-30 | Discharge: 2023-04-30 | Disposition: A | Discharge status: Home / Self Care | Payer: Medicaid Other | Attending: Emergency Medicine | Admitting: Emergency Medicine

## 2023-04-30 ENCOUNTER — Other Ambulatory Visit: Payer: Self-pay

## 2023-04-30 ENCOUNTER — Encounter (HOSPITAL_COMMUNITY): Payer: Self-pay

## 2023-04-30 ENCOUNTER — Emergency Department (HOSPITAL_COMMUNITY): Payer: Medicaid Other

## 2023-04-30 DIAGNOSIS — E119 Type 2 diabetes mellitus without complications: Secondary | ICD-10-CM | POA: Diagnosis not present

## 2023-04-30 DIAGNOSIS — J101 Influenza due to other identified influenza virus with other respiratory manifestations: Secondary | ICD-10-CM | POA: Diagnosis not present

## 2023-04-30 DIAGNOSIS — K5730 Diverticulosis of large intestine without perforation or abscess without bleeding: Secondary | ICD-10-CM | POA: Diagnosis not present

## 2023-04-30 DIAGNOSIS — R1010 Upper abdominal pain, unspecified: Secondary | ICD-10-CM | POA: Diagnosis not present

## 2023-04-30 DIAGNOSIS — R1013 Epigastric pain: Secondary | ICD-10-CM | POA: Insufficient documentation

## 2023-04-30 DIAGNOSIS — R1110 Vomiting, unspecified: Secondary | ICD-10-CM | POA: Diagnosis not present

## 2023-04-30 DIAGNOSIS — D72829 Elevated white blood cell count, unspecified: Secondary | ICD-10-CM | POA: Diagnosis not present

## 2023-04-30 DIAGNOSIS — Z20822 Contact with and (suspected) exposure to covid-19: Secondary | ICD-10-CM | POA: Diagnosis not present

## 2023-04-30 DIAGNOSIS — R1011 Right upper quadrant pain: Secondary | ICD-10-CM | POA: Diagnosis not present

## 2023-04-30 LAB — COMPREHENSIVE METABOLIC PANEL
ALT: 21 U/L (ref 0–44)
AST: 23 U/L (ref 15–41)
Albumin: 3.7 g/dL (ref 3.5–5.0)
Alkaline Phosphatase: 66 U/L (ref 38–126)
Anion gap: 11 (ref 5–15)
BUN: 19 mg/dL (ref 6–20)
CO2: 24 mmol/L (ref 22–32)
Calcium: 8.6 mg/dL — ABNORMAL LOW (ref 8.9–10.3)
Chloride: 97 mmol/L — ABNORMAL LOW (ref 98–111)
Creatinine, Ser: 0.81 mg/dL (ref 0.44–1.00)
GFR, Estimated: >60 mL/min (ref >60)
Glucose, Bld: 124 mg/dL — ABNORMAL HIGH (ref 70–99)
Potassium: 3.8 mmol/L (ref 3.5–5.1)
Sodium: 132 mmol/L — ABNORMAL LOW (ref 135–145)
Total Bilirubin: 0.7 mg/dL (ref 0.0–1.2)
Total Protein: 7.6 g/dL (ref 6.5–8.1)

## 2023-04-30 LAB — RESP PANEL BY RT-PCR (RSV, FLU A&B, COVID)  RVPGX2
Influenza A by PCR: POSITIVE — AB
Influenza B by PCR: NEGATIVE
Resp Syncytial Virus by PCR: NEGATIVE
SARS Coronavirus 2 by RT PCR: NEGATIVE

## 2023-04-30 LAB — URINALYSIS, ROUTINE W REFLEX MICROSCOPIC
Bilirubin Urine: NEGATIVE
Glucose, UA: NEGATIVE mg/dL
Hgb urine dipstick: NEGATIVE
Ketones, ur: NEGATIVE mg/dL
Leukocytes,Ua: NEGATIVE
Nitrite: NEGATIVE
Protein, ur: 30 mg/dL — AB
Specific Gravity, Urine: 1.028 (ref 1.005–1.030)
pH: 5 (ref 5.0–8.0)

## 2023-04-30 LAB — CBC
HCT: 39.7 % (ref 36.0–46.0)
Hemoglobin: 12.3 g/dL (ref 12.0–15.0)
MCH: 25.8 pg — ABNORMAL LOW (ref 26.0–34.0)
MCHC: 31 g/dL (ref 30.0–36.0)
MCV: 83.4 fL (ref 80.0–100.0)
Platelets: 336 10*3/uL (ref 150–400)
RBC: 4.76 MIL/uL (ref 3.87–5.11)
RDW: 14.1 % (ref 11.5–15.5)
WBC: 16.4 10*3/uL — ABNORMAL HIGH (ref 4.0–10.5)
nRBC: 0 % (ref 0.0–0.2)

## 2023-04-30 LAB — LIPASE, BLOOD: Lipase: 33 U/L (ref 11–51)

## 2023-04-30 LAB — HCG, SERUM, QUALITATIVE: Preg, Serum: NEGATIVE

## 2023-04-30 MED ORDER — ONDANSETRON HCL 4 MG/2ML IJ SOLN
4.0000 mg | Freq: Once | INTRAMUSCULAR | Status: AC
  Administered 2023-04-30: 4 mg via INTRAVENOUS
  Filled 2023-04-30: qty 2

## 2023-04-30 MED ORDER — SODIUM CHLORIDE 0.9 % IV BOLUS
1000.0000 mL | Freq: Once | INTRAVENOUS | Status: AC
  Administered 2023-04-30: 1000 mL via INTRAVENOUS

## 2023-04-30 MED ORDER — ALUM & MAG HYDROXIDE-SIMETH 200-200-20 MG/5ML PO SUSP
30.0000 mL | Freq: Once | ORAL | Status: AC
  Administered 2023-04-30: 30 mL via ORAL
  Filled 2023-04-30: qty 30

## 2023-04-30 MED ORDER — ONDANSETRON HCL 4 MG PO TABS
4.0000 mg | ORAL_TABLET | Freq: Four times a day (QID) | ORAL | 0 refills | Status: DC
Start: 2023-04-30 — End: 2024-02-10

## 2023-04-30 MED ORDER — IOHEXOL 350 MG/ML SOLN
75.0000 mL | Freq: Once | INTRAVENOUS | Status: AC | PRN
  Administered 2023-04-30: 75 mL via INTRAVENOUS

## 2023-04-30 MED ORDER — LIDOCAINE VISCOUS HCL 2 % MT SOLN
15.0000 mL | Freq: Once | OROMUCOSAL | Status: AC
  Administered 2023-04-30: 15 mL via ORAL
  Filled 2023-04-30: qty 15

## 2023-04-30 MED ORDER — FAMOTIDINE IN NACL 20-0.9 MG/50ML-% IV SOLN
20.0000 mg | Freq: Once | INTRAVENOUS | Status: AC
  Administered 2023-04-30: 20 mg via INTRAVENOUS
  Filled 2023-04-30: qty 50

## 2023-04-30 MED ORDER — OSELTAMIVIR PHOSPHATE 75 MG PO CAPS: 75.0000 mg | ORAL_CAPSULE | Freq: Two times a day (BID) | ORAL | 0 refills | Status: DC

## 2023-04-30 NOTE — ED Notes (Signed)
Back from ct.

## 2023-04-30 NOTE — ED Notes (Signed)
Patient transported to CT 

## 2023-04-30 NOTE — Discharge Instructions (Signed)
Please follow-up with your primary care provider regards recent symptoms and ER visit.  Today your labs and imaging do show that you test positive for the flu which would explain her symptoms.  As we discussed I will prescribe Tamiflu and Zofran to help with your nausea.  Please take Tylenol every 6 hours needed for pain and avoid ibuprofen as he do have history of peptic ulcers.  Please remain hydrated and eat food as tolerated.  Please continue your Protonix as prescribed as well.  If symptoms change or worsen please return to the ER.

## 2023-04-30 NOTE — ED Triage Notes (Signed)
Pt arrived POV for "severe abd pain" to RUQ that started at 4 am with n/v. Denies any other s/s, VSS, A&O x4, NAD noted.

## 2023-04-30 NOTE — ED Provider Notes (Signed)
[place] EMERGENCY DEPARTMENT AT [facility] Provider Note   CSN: [id number] Arrival date & time: 04/30/23  5284     History  Chief Complaint  Patient presents with   Abdominal Pain    Sarah Villanueva is a 33 y.o. female with peptic ulcers, type 2 diabetes presented for epigastric/right upper quadrant pain that began around 4 AM this morning.  Patient does endorse nausea vomiting and burning sensation in her epigastrium but denies any chest pain or shortness of breath or fevers.  Patient has any vaginal concerns or dysuria.  Pain does not radiate.  Patient still has her gallbladder.  Home Medications Prior to Admission medications   Medication Sig Start Date End Date Taking? Authorizing Provider  ondansetron (ZOFRAN) 4 MG tablet Take 1 tablet (4 mg total) by mouth every 6 (six) hours. 04/30/23  Yes Netta Corrigan, PA-C  oseltamivir (TAMIFLU) 75 MG capsule Take 1 capsule (75 mg total) by mouth every 12 (twelve) hours. 04/30/23  Yes Netta Corrigan, PA-C  acetaminophen (TYLENOL) 160 MG/5ML solution Take 20.3 mLs (650 mg total) by mouth every 4 (four) hours as needed for mild pain. Patient not taking: Reported on 02/06/2023 12/16/22   Wouk, Wilfred Curtis, MD  ibuprofen (ADVIL) 100 MG/5ML suspension Take 30 mLs (600 mg total) by mouth every 6 (six) hours. Patient not taking: Reported on 02/06/2023 12/16/22   Wouk, Wilfred Curtis, MD  Prenatal Vit-Fe Fumarate-FA (PRENATAL MULTIVITAMIN) TABS tablet Take 1 tablet by mouth daily at 12 noon. Patient not taking: Reported on 02/06/2023    [provider]      Allergies    Patient has no known allergies.    Review of Systems   Review of Systems  Gastrointestinal:  Positive for abdominal pain.    Physical Exam Updated Vital Signs BP 103/67   Pulse 93   Temp 98.2 F (36.8 C)   Resp 18   Ht 5\' 11"  (1.803 m)   Wt 117.9 kg   LMP 04/09/2023 (Approximate)   SpO2 97%   BMI 36.26 kg/m  Physical Exam Vitals reviewed.   Constitutional:      General: She is not in acute distress. HENT:     Head: Normocephalic and atraumatic.  Eyes:     Extraocular Movements: Extraocular movements intact.     Conjunctiva/sclera: Conjunctivae normal.     Pupils: Pupils are equal, round, and reactive to light.  Cardiovascular:     Rate and Rhythm: Normal rate and regular rhythm.     Pulses: Normal pulses.     Heart sounds: Normal heart sounds.     Comments: 2+ bilateral radial/dorsalis pedis pulses with regular rate Pulmonary:     Effort: Pulmonary effort is normal. No respiratory distress.     Breath sounds: Normal breath sounds.  Abdominal:     Palpations: Abdomen is soft.     Tenderness: There is abdominal tenderness in the right upper quadrant and epigastric area. There is no guarding or rebound. Negative signs include Murphy's sign, Rovsing's sign, McBurney's sign and psoas sign.  Musculoskeletal:        General: Normal range of motion.     Cervical back: Normal range of motion and neck supple.     Comments: 5 out of 5 bilateral grip/leg extension strength  Skin:    General: Skin is warm and dry.     Capillary Refill: Capillary refill takes less than 2 seconds.  Neurological:     General: No focal deficit present.  Mental Status: She is alert and oriented to person, place, and time.     Comments: Sensation intact in all 4 limbs  Psychiatric:        Mood and Affect: Mood normal.     ED Results / Procedures / Treatments   Labs (all labs ordered are listed, but only abnormal results are displayed) Labs Reviewed  RESP PANEL BY RT-PCR (RSV, FLU A&B, COVID)  RVPGX2 - Abnormal; Notable for the following components:      Result Value   Influenza A by PCR POSITIVE (*)    All other components within normal limits  COMPREHENSIVE METABOLIC PANEL - Abnormal; Notable for the following components:   Sodium 132 (*)    Chloride 97 (*)    Glucose, Bld 124 (*)    Calcium 8.6 (*)    All other components within  normal limits  CBC - Abnormal; Notable for the following components:   WBC 16.4 (*)    MCH 25.8 (*)    All other components within normal limits  URINALYSIS, ROUTINE W REFLEX MICROSCOPIC - Abnormal; Notable for the following components:   APPearance HAZY (*)    Protein, ur 30 (*)    Bacteria, UA RARE (*)    All other components within normal limits  LIPASE, BLOOD  HCG, SERUM, QUALITATIVE    EKG None  Radiology CT ABDOMEN PELVIS W CONTRAST Result Date: 04/30/2023 CLINICAL DATA:  Acute upper abdominal pain.  Leukocytosis. EXAM: CT ABDOMEN AND PELVIS WITH CONTRAST TECHNIQUE: Multidetector CT imaging of the abdomen and pelvis was performed using the standard protocol following bolus administration of intravenous contrast. RADIATION DOSE REDUCTION: This exam was performed according to the departmental dose-optimization program which includes automated exposure control, adjustment of the mA and/or kV according to patient size and/or use of iterative reconstruction technique. CONTRAST:  75mL OMNIPAQUE IOHEXOL 350 MG/ML SOLN COMPARISON:  CT abdomen pelvis dated 04/27/2021. FINDINGS: Lower chest: The visualized lung bases are clear. No intra-abdominal free air.  Trace free fluid within the pelvis. Hepatobiliary: Apparent fatty liver. No biliary dilatation. The gallbladder is unremarkable. Pancreas: Unremarkable. No pancreatic ductal dilatation or surrounding inflammatory changes. Spleen: Normal in size without focal abnormality. Adrenals/Urinary Tract: The adrenal glands unremarkable. The kidneys, visualized ureters, and urinary bladder appear unremarkable. Stomach/Bowel: Normal caliber fluid-filled loops of small bowel may be physiologic. Enteritis is not excluded clinical correlation is recommended. There is no bowel obstruction. There is loose stool in the proximal colon. Formed stool noted in the rectosigmoid. There is sigmoid diverticulosis without active inflammatory changes. The appendix is normal.  Vascular/Lymphatic: The abdominal aorta and IVC are unremarkable. No portal venous gas. There is no adenopathy. Reproductive: The uterus is anteverted and grossly unremarkable. No suspicious adnexal masses. Other: None Musculoskeletal: No acute or significant osseous findings. IMPRESSION: 1. Physiologic appearance of the small bowel versus less likely enteritis. Clinical correlation is recommended. No bowel obstruction. Normal appendix. 2. Sigmoid diverticulosis. 3. Fatty liver. Electronically Signed   By: Elgie Collard M.D.   On: 04/30/2023 10:48   US Abdomen Limited RUQ (LIVER/GB) Result Date: 04/30/2023 CLINICAL DATA:  Right upper quadrant pain and vomiting EXAM: ULTRASOUND ABDOMEN LIMITED RIGHT UPPER QUADRANT COMPARISON:  None Available. FINDINGS: Gallbladder: No gallstones or wall thickening visualized. No sonographic Murphy sign noted by sonographer. Common bile duct: Diameter: 3 mm Liver: Diffusely increased hepatic echogenicity can be seen in the setting of infiltrative process such as steatosis. Portal vein is patent on color Doppler imaging with normal direction of  blood flow towards the liver. Other: None. IMPRESSION: 1. Diffusely increased hepatic echogenicity can be seen in the setting of infiltrative process such as steatosis. 2. No cholelithiasis or sonographic evidence of acute cholecystitis. Electronically Signed   By: Agustin Cree M.D.   On: 04/30/2023 08:21    Procedures .Ultrasound ED Peripheral IV (Provider)  Date/Time: 04/30/2023 9:06 AM  Performed by: Netta Corrigan, PA-C Authorized by: Netta Corrigan, PA-C   Procedure details:    Indications: hydration and poor IV access     Skin Prep: isopropyl alcohol     Location:  Right AC   Angiocath:  20 G   Bedside Ultrasound Guided: Yes     Images: archived     Patient tolerated procedure without complications: Yes     Dressing applied: Yes       Medications Ordered in ED Medications  famotidine (PEPCID) IVPB 20 mg premix  (0 mg Intravenous Stopped 04/30/23 0952)  ondansetron (ZOFRAN) injection 4 mg (4 mg Intravenous Given 04/30/23 0913)  sodium chloride 0.9 % bolus 1,000 mL (1,000 mLs Intravenous New Bag/Given 04/30/23 0920)  iohexol (OMNIPAQUE) 350 MG/ML injection 75 mL (75 mLs Intravenous Contrast Given 04/30/23 1030)  alum & mag hydroxide-simeth (MAALOX/MYLANTA) 200-200-20 MG/5ML suspension 30 mL (30 mLs Oral Given 04/30/23 1107)    And  lidocaine (XYLOCAINE) 2 % viscous mouth solution 15 mL (15 mLs Oral Given 04/30/23 1107)    ED Course/ Medical Decision Making/ A&P                                 Medical Decision Making Amount and/or Complexity of Data Reviewed Labs: ordered. Radiology: ordered.  Risk Prescription drug management.   Johniya Durfee 33 y.o. presented today for abdominal pain.  Working DDx that I considered at this time includes, but not limited to, gastroenteritis, colitis, small bowel obstruction, appendicitis, cholecystitis, hepatobiliary pathology, gastritis, PUD, ACS, aortic dissection, diverticulosis/diverticulitis, pancreatitis, nephrolithiasis, medication induced, AAA, UTI, pyelonephritis, ruptured ectopic pregnancy, PID, ovarian torsion.  R/o DDx: gastroenteritis, colitis, small bowel obstruction, appendicitis, cholecystitis, hepatobiliary pathology, gastritis, PUD, ACS, aortic dissection, diverticulosis/diverticulitis, pancreatitis, nephrolithiasis, medication induced, AAA, UTI, pyelonephritis, ruptured ectopic pregnancy, PID, ovarian torsion: These are considered less likely due to history of present illness, physical exam, labs/imaging findings.  Review of prior external notes: 12/14/2022 discharge  Unique Tests and My Interpretation:  CBC with differential: Leukocytosis 16.4 CMP: Unremarkable Lipase: Unremarkable UA: Unremarkable Serum pregnancy: Negative Influenza A positive DTM pelvis with contrast: No acute findings Right upper quadrant ultrasound: No acute  findings  Social Determinants of Health: none  Discussion with Independent Historian:  Father  Discussion of Management of Tests: None  Risk: Medium: prescription drug management  Risk Stratification Score: None  Plan: On exam patient was in no acute distress with stable vitals.  On exam patient does have epigastric/right upper quadrant tenderness without peritoneal signs.  Patient states she does have history of ulcer and has not taken any antacids so we will give her Protonix to the IV along with fluids and Zofran for her symptoms.  Labs from triage do show leukocytosis however upper quadrant ultrasound did not show any acute findings besides steatosis which was verbalized to the patient.  Will get CT scan to rule out other pathology however if that is negative do feel that patient may have viral illness and will need to be p.o. challenge before going home.  CT scans were negative.  Patient did test positive for influenza A and is within the first 48 hours of symptoms after discussion with the patient we will give her Tamiflu.  Will prescribe patient Zofran for nausea at home along with encouraging her to use her Protonix following up with her primary care provider and use Tylenol every 6 hours as needed pain to avoid NSAIDs due to history of stomach ulcer.  Patient was p.o. challenged successfully and is stable to go home at this time follow-up with a primary care provider.  Patient was given return precautions. Patient stable for discharge at this time.  Patient verbalized understanding of plan.  This chart was dictated using voice recognition software.  Despite best efforts to proofread,  errors can occur which can change the documentation meaning.         Final Clinical Impression(s) / ED Diagnoses Final diagnoses:  Influenza A    Rx / DC Orders ED Discharge Orders          Ordered    oseltamivir (TAMIFLU) 75 MG capsule  Every 12 hours        04/30/23 1051    ondansetron  (ZOFRAN) 4 MG tablet  Every 6 hours        04/30/23 1051              Netta Corrigan, PA-C 04/30/23 1152    Anders Simmonds T, DO 05/01/23 2250

## 2023-05-13 DIAGNOSIS — Z13220 Encounter for screening for lipoid disorders: Secondary | ICD-10-CM | POA: Diagnosis not present

## 2023-05-13 DIAGNOSIS — Z1389 Encounter for screening for other disorder: Secondary | ICD-10-CM | POA: Diagnosis not present

## 2023-05-13 DIAGNOSIS — R1013 Epigastric pain: Secondary | ICD-10-CM | POA: Diagnosis not present

## 2023-05-13 DIAGNOSIS — G43909 Migraine, unspecified, not intractable, without status migrainosus: Secondary | ICD-10-CM | POA: Diagnosis not present

## 2023-05-13 DIAGNOSIS — Z131 Encounter for screening for diabetes mellitus: Secondary | ICD-10-CM | POA: Diagnosis not present

## 2023-06-17 DIAGNOSIS — H5213 Myopia, bilateral: Secondary | ICD-10-CM | POA: Diagnosis not present

## 2023-06-22 DIAGNOSIS — O24419 Gestational diabetes mellitus in pregnancy, unspecified control: Secondary | ICD-10-CM | POA: Diagnosis not present

## 2023-08-05 DIAGNOSIS — Z1389 Encounter for screening for other disorder: Secondary | ICD-10-CM | POA: Diagnosis not present

## 2023-08-05 DIAGNOSIS — B084 Enteroviral vesicular stomatitis with exanthem: Secondary | ICD-10-CM | POA: Diagnosis not present

## 2023-08-10 ENCOUNTER — Ambulatory Visit: Admission: EM | Admit: 2023-08-10 | Discharge: 2023-08-10 | Disposition: A | Discharge status: Home / Self Care

## 2023-09-14 ENCOUNTER — Telehealth: Payer: Self-pay

## 2023-09-14 NOTE — Telephone Encounter (Signed)
 RN received Home Care Delivered request for medical supplies for patient. RN attempted outreach to patient regarding the request, left HIPAA compliant voicemail to return RN call.  Evelia Hipp, RN

## 2023-09-15 IMAGING — CT CT ABD-PELV W/ CM
2 of 4 series · 16 of 46 positions shown, 18 images · IV contrast (agent unspecified)
Comparison: None.

CLINICAL DATA: Diverticulitis, complication suspected. Nausea,
vomiting, diarrhea.

EXAM:
CT ABDOMEN AND PELVIS WITH CONTRAST
TECHNIQUE: Multidetector CT imaging of the abdomen and pelvis was performed
using the standard protocol following bolus administration of
intravenous contrast.

[Series 3: abdomen 5.0 · axial · 0.95mm/px · z∈[+703,+1203]mm · 13 of 112 slices shown, 15 images]
[im 6/112  soft-tissue]
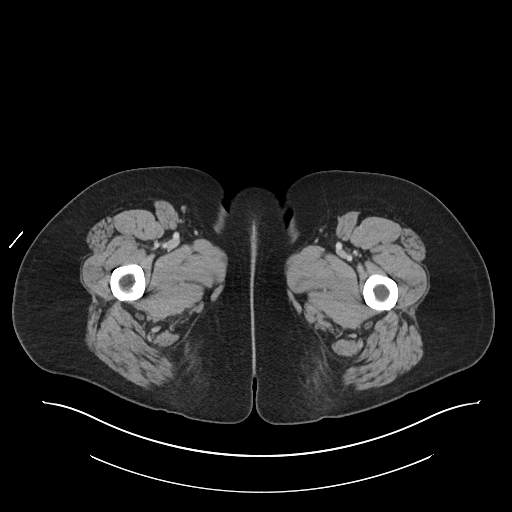
[im 6/112  bone]
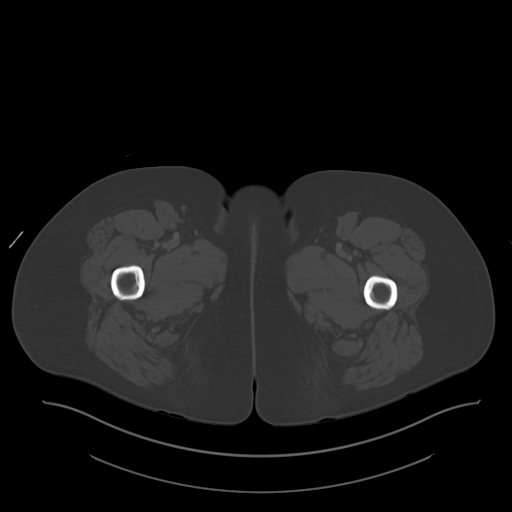
[im 17/112  soft-tissue]
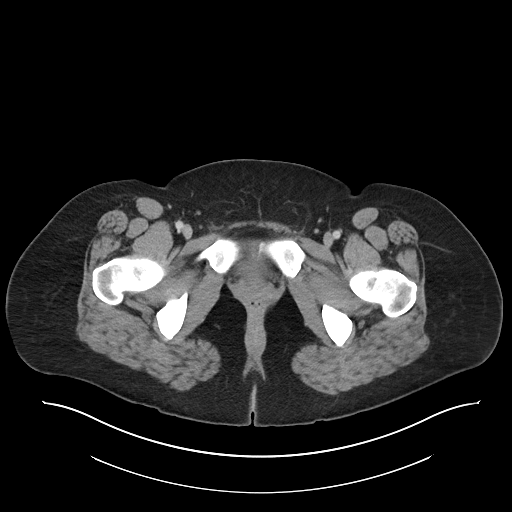
[im 23/112  soft-tissue]
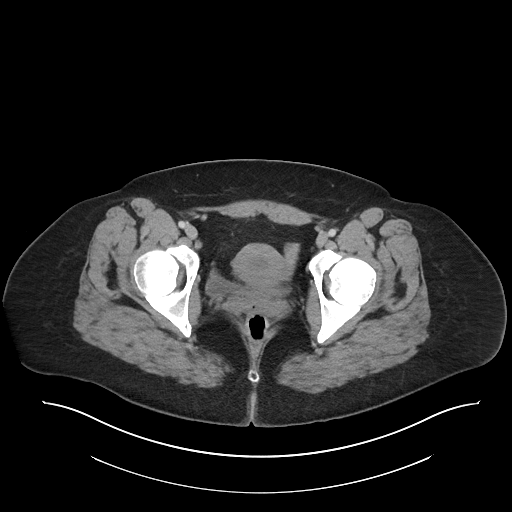
[im 34/112  soft-tissue]
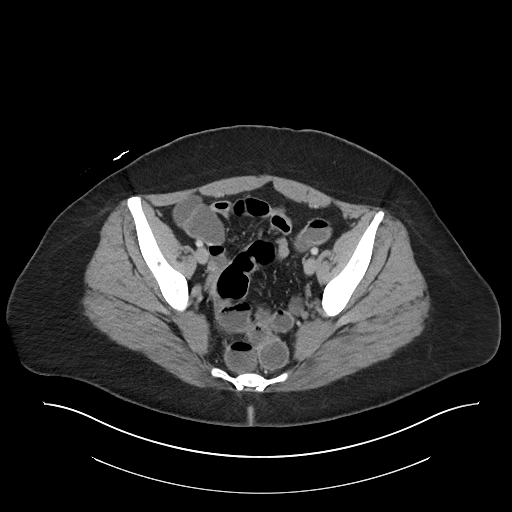
[im 39/112  soft-tissue]
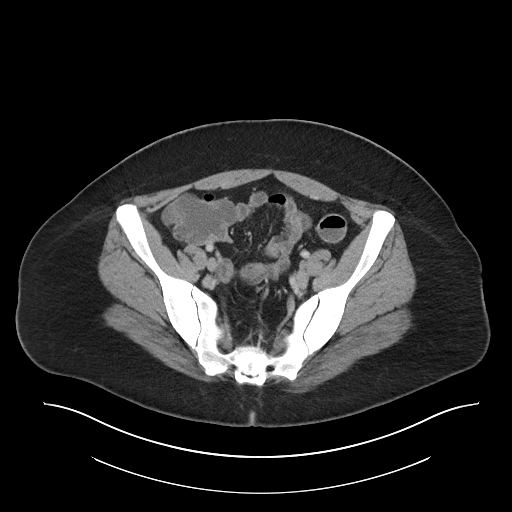
[im 50/112  soft-tissue]
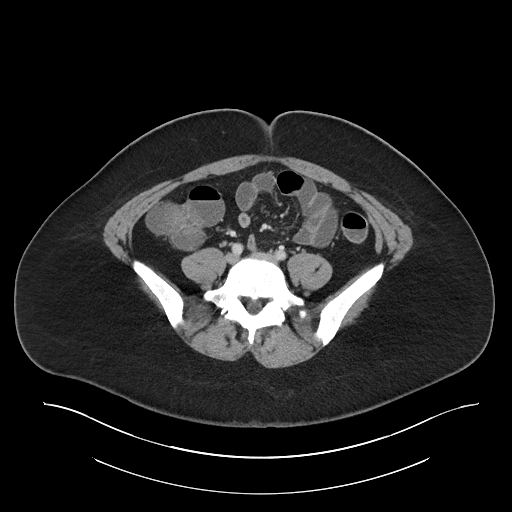
[im 56/112  soft-tissue]
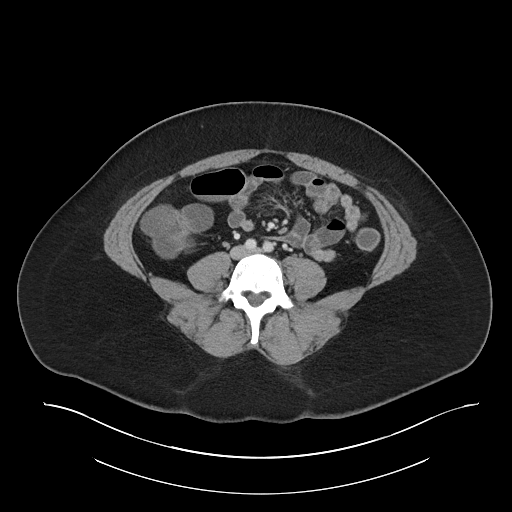
[im 62/112  soft-tissue]
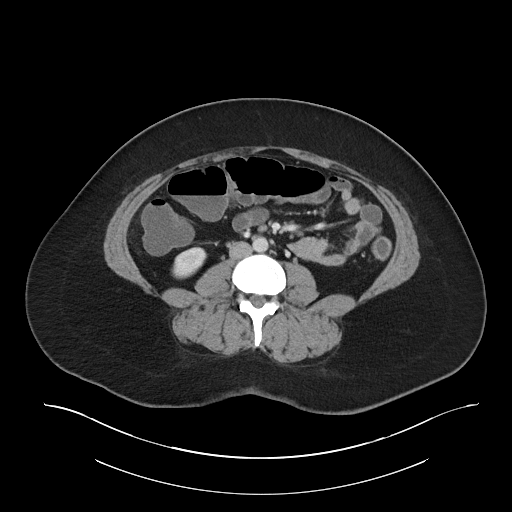
[im 73/112  soft-tissue]
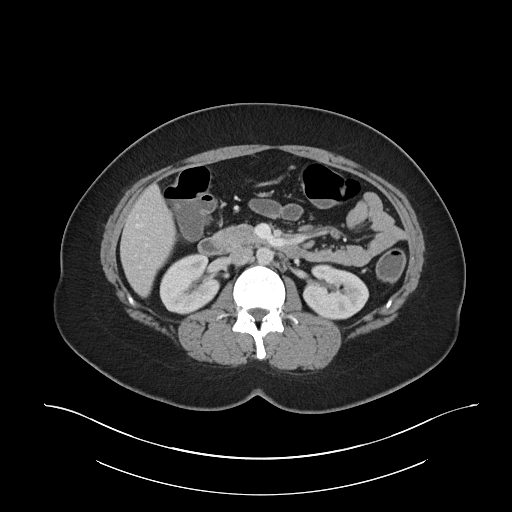
[im 73/112  bone]
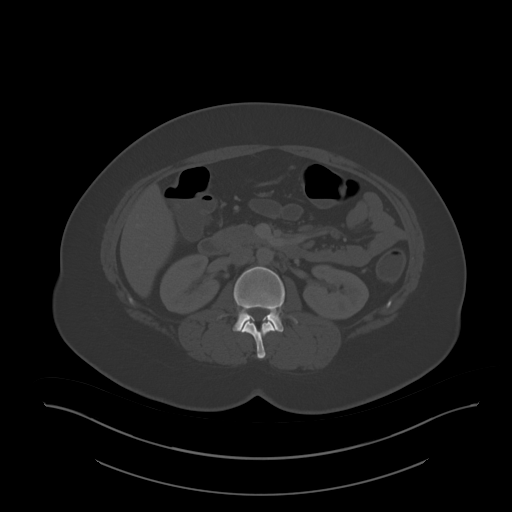
[im 78/112  soft-tissue]
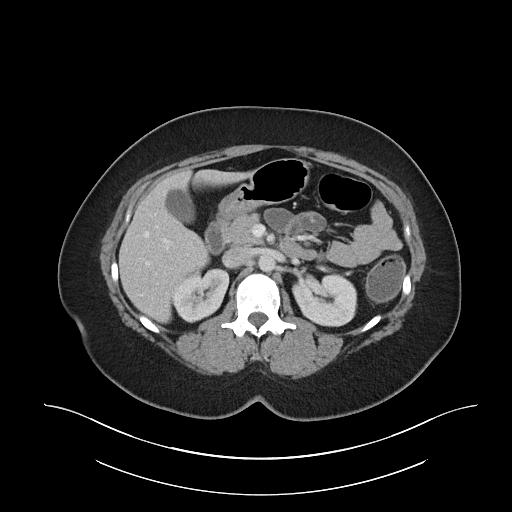
[im 89/112  soft-tissue]
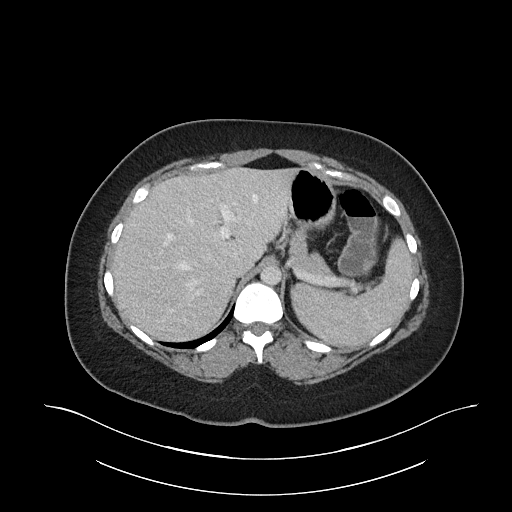
[im 95/112  soft-tissue]
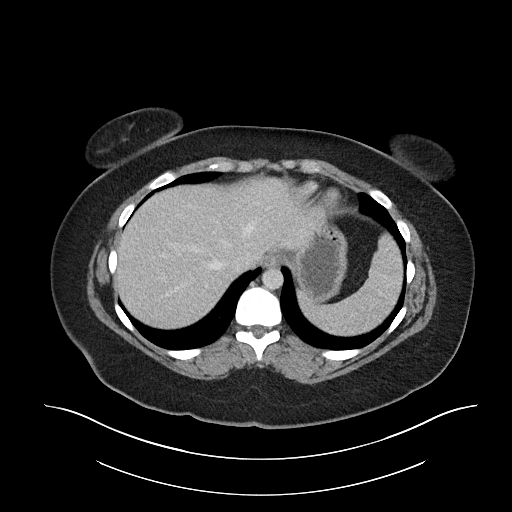
[im 106/112  soft-tissue]
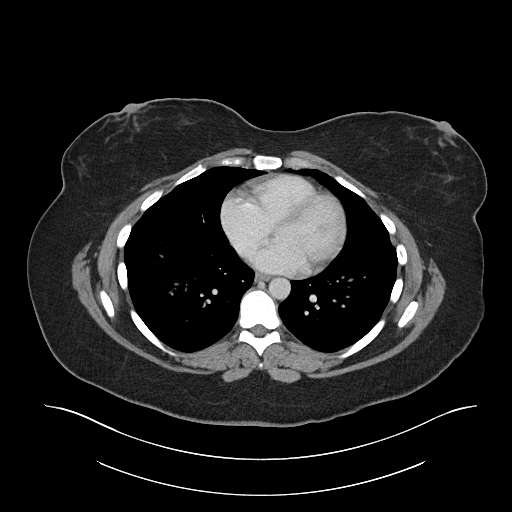

[Series 6: abdomen 3.0 mpr cor · coronal · 0.94mm/px · 3 of 113 slices shown]
[im 38/113  soft-tissue]
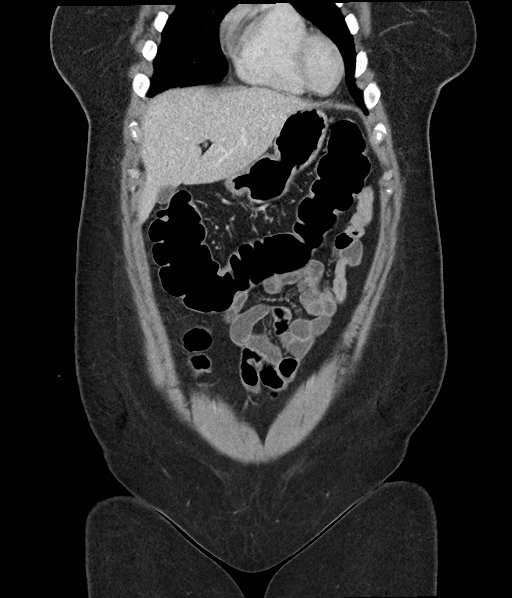
[im 50/113  soft-tissue]
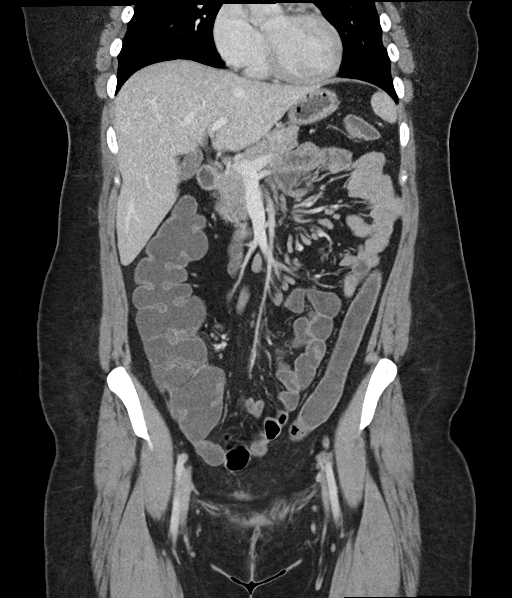
[im 63/113  soft-tissue]
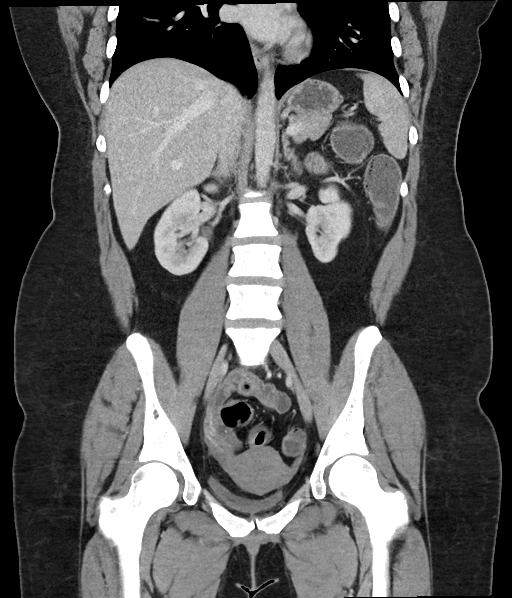

[16 of 46 positions shown; findings below may reference images not displayed]

RADIATION DOSE REDUCTION: This exam was performed according to the
departmental dose-optimization program which includes automated
exposure control, adjustment of the mA and/or kV according to
patient size and/or use of iterative reconstruction technique.

CONTRAST:  100mL OMNIPAQUE IOHEXOL 350 MG/ML SOLN
FINDINGS: Lower chest: No acute abnormality.

Hepatobiliary: No focal liver abnormality is seen. No gallstones,
gallbladder wall thickening, or biliary dilatation.

Pancreas: Unremarkable. No pancreatic ductal dilatation or
surrounding inflammatory changes.

Spleen: Normal in size without focal abnormality.

Adrenals/Urinary Tract: Adrenal glands are unremarkable. Kidneys are
normal, without renal calculi, focal lesion, or hydronephrosis.
Bladder is unremarkable.

Stomach/Bowel: The stomach is within normal limits. No bowel
obstruction, free air, or pneumatosis. The appendix is within normal
limits. There is no evidence of diverticulosis or diverticulitis.
Nonspecific air-fluid levels are present throughout the colon and
small bowel. There is loss of normal haustral pattern at the
ascending colon. No focal bowel wall thickening.

Vascular/Lymphatic: The vasculature is within normal limits. A
nonspecific prominent lymph node is noted in the mesentery measuring
up to 1 cm.

Reproductive: The uterus is in-situ. A 1.7 cm cyst is present in the
right ovary, likely dominant follicle.

Other: No abdominal wall hernia or abnormality. No abdominopelvic
ascites.

Musculoskeletal: No acute or significant osseous findings.
IMPRESSION: 1. No evidence of diverticulosis or diverticulitis.
2. Nonspecific air-fluid levels in the small bowel and colon, may be
associated with enteritis/colitis in the appropriate clinical
setting.
3. Loss of normal haustral pattern in the descending colon is
nonspecific and may be associated with inflammatory bowel disease.

## 2024-02-10 ENCOUNTER — Ambulatory Visit: Admitting: Obstetrics and Gynecology

## 2024-02-10 VITALS — BP 131/82 | HR 83 | Ht 71.0 in | Wt 277.1 lb

## 2024-02-10 DIAGNOSIS — Z3202 Encounter for pregnancy test, result negative: Secondary | ICD-10-CM | POA: Diagnosis not present

## 2024-02-10 DIAGNOSIS — N911 Secondary amenorrhea: Secondary | ICD-10-CM | POA: Diagnosis not present

## 2024-02-10 LAB — POCT URINE PREGNANCY: Preg Test, Ur: NEGATIVE

## 2024-02-10 MED ORDER — MEDROXYPROGESTERONE ACETATE 10 MG PO TABS: 10.0000 mg | ORAL_TABLET | Freq: Every day | ORAL | 0 refills | Status: DC

## 2024-02-10 NOTE — Progress Notes (Signed)
   RETURN GYNECOLOGY VISIT  Subjective:  Sarah Villanueva is a 33 y.o. [id number] with LMP 10/29/23 presenting for amenorrhea  Cycles regular after the birth of her daughter Sarah Villanueva. The consistency of period blood was different but still monthly cycle. Last cycle July, so has gone 3 months with no period. 2 days of spotting only in Sept. No hot flashes, vaginal dryness, acne, hirsutism. No medications. Has had trouble losing weight since her daughter's birth but no other signs/symptoms besides missed period.   Objective:   Vitals:   02/10/24 0831  BP: 131/82  Pulse: 83  Weight: 277 lb 1.3 oz (125.7 kg)  Height: 5' 11 (1.803 m)   General:  Alert, oriented and cooperative. Patient is in no acute distress.  Skin: Skin is warm and dry. No rash noted.   Cardiovascular: Normal heart rate noted  Respiratory: Normal respiratory effort, no problems with respiration noted  Abdomen: Soft, non-tender, non-distended    Assessment and Plan:  Sarah Villanueva is a 33 y.o. with secondary amenorrhea  Secondary amenorrhea -     Prolactin -     FSH -     Estradiol -     POCT urine pregnancy -     COMPLETE METABOLIC PANEL WITH eGFR -     TSH Rfx on Abnormal to Free T4 -     CBC with Differential/Platelet  Urine pregnancy test negative -     POCT urine pregnancy  Other orders -     medroxyPROGESTERone (PROVERA) 10 MG tablet; Take 1 tablet (10 mg total) by mouth daily. Use for ten days  Return in about 4 weeks (around 03/09/2024) for follow up period.  Future Appointments  Date Time Provider Department Center  03/09/2024  8:50 AM Erik Kieth BROCKS, MD [facility]   Kieth BROCKS Erik, MD

## 2024-02-11 ENCOUNTER — Ambulatory Visit: Payer: Self-pay | Admitting: Obstetrics and Gynecology

## 2024-02-11 LAB — CBC WITH DIFFERENTIAL/PLATELET
Basophils Absolute: 0 x10E3/uL (ref 0.0–0.2)
Basos: 0 %
EOS (ABSOLUTE): 0.1 x10E3/uL (ref 0.0–0.4)
Eos: 1 %
Hematocrit: 38.3 % (ref 34.0–46.6)
Hemoglobin: 11.9 g/dL (ref 11.1–15.9)
Immature Grans (Abs): 0 x10E3/uL (ref 0.0–0.1)
Immature Granulocytes: 0 %
Lymphocytes Absolute: 2.3 x10E3/uL (ref 0.7–3.1)
Lymphs: 22 %
MCH: 24.9 pg — ABNORMAL LOW (ref 26.6–33.0)
MCHC: 31.1 g/dL — ABNORMAL LOW (ref 31.5–35.7)
MCV: 80 fL (ref 79–97)
Monocytes Absolute: 0.6 x10E3/uL (ref 0.1–0.9)
Monocytes: 5 %
Neutrophils Absolute: 7.4 x10E3/uL — ABNORMAL HIGH (ref 1.4–7.0)
Neutrophils: 72 %
Platelets: 371 x10E3/uL (ref 150–450)
RBC: 4.78 x10E6/uL (ref 3.77–5.28)
RDW: 14.5 % (ref 11.7–15.4)
WBC: 10.5 x10E3/uL (ref 3.4–10.8)

## 2024-02-11 LAB — ESTRADIOL: Estradiol: 174 pg/mL

## 2024-02-11 LAB — TSH RFX ON ABNORMAL TO FREE T4: TSH: 5.22 u[IU]/mL — ABNORMAL HIGH (ref 0.450–4.500)

## 2024-02-11 LAB — T4F: T4,Free (Direct): 0.9 ng/dL (ref 0.82–1.77)

## 2024-02-11 LAB — PROLACTIN: Prolactin: 11.1 ng/mL (ref 4.8–33.4)

## 2024-02-11 LAB — FOLLICLE STIMULATING HORMONE: FSH: 3.3 m[IU]/mL

## 2024-03-09 ENCOUNTER — Ambulatory Visit: Admitting: Obstetrics and Gynecology

## 2024-03-10 ENCOUNTER — Other Ambulatory Visit: Payer: Self-pay

## 2024-03-10 ENCOUNTER — Ambulatory Visit: Admitting: Internal Medicine

## 2024-03-10 VITALS — BP 120/70 | HR 98 | Temp 98.0°F | Resp 16 | Ht 70.5 in | Wt 283.3 lb

## 2024-03-10 DIAGNOSIS — B009 Herpesviral infection, unspecified: Secondary | ICD-10-CM | POA: Diagnosis not present

## 2024-03-10 DIAGNOSIS — E039 Hypothyroidism, unspecified: Secondary | ICD-10-CM | POA: Diagnosis not present

## 2024-03-10 DIAGNOSIS — Z23 Encounter for immunization: Secondary | ICD-10-CM | POA: Diagnosis not present

## 2024-03-10 MED ORDER — LEVOTHYROXINE SODIUM 25 MCG PO TABS: 25.0000 ug | ORAL_TABLET | Freq: Every day | ORAL | 1 refills | Status: DC

## 2024-03-10 MED ORDER — VALACYCLOVIR HCL 1 G PO TABS: 1000.0000 mg | ORAL_TABLET | Freq: Two times a day (BID) | ORAL | 0 refills | Status: AC

## 2024-03-10 NOTE — Progress Notes (Signed)
 New Patient Office Visit  Subjective    Patient ID: Sarah Villanueva, female    DOB: 09/26/1990  Age: 33 y.o. MRN: [id number]  CC:  Chief Complaint  Patient presents with   Establish Care    HPI [name] presents to establish care. She has no chronic medical conditions and takes no regular medications.   Discussed the use of AI scribe software for clinical note transcription with the patient, who gave verbal consent to proceed.  History of Present Illness Sarah Villanueva is a 33 year old female who presents with irregular periods for evaluation of thyroid  function.  Her last regular menstrual period was in July. Since then she has had only spotting. Provera  induced only two days of bleeding and caused emotional changes, irritability, and migraines.  She has elevated TSH at 5.2 with low-normal free T4 at 0.9 and reports significant weight gain since her last pregnancy. Her father has hypothyroidism.  She had gestational diabetes treated with metformin . Postpartum blood sugars were initially elevated but normalized. She has a family history of diabetes and hypertension.  Her mother had cervical cancer and other relatives had breast, bladder, and skin cancers. She has not received HPV vaccination. She is on no current medications. She has no current HSV outbreak and averages one breakout per year when not pregnant.  Health Maintenance: -Blood work UTD -Pap 2/24 negative  -HPV vaccines due  Outpatient Encounter Medications as of 03/10/2024  Medication Sig   medroxyPROGESTERone  (PROVERA ) 10 MG tablet Take 1 tablet (10 mg total) by mouth daily. Use for ten days   No facility-administered encounter medications on file as of 03/10/2024.    Past Medical History:  Diagnosis Date   Cystic fibrosis carrier    Gestational diabetes    HSV infection    on Valtrex    Obesity    Trichomonal vaginitis during pregnancy     No past surgical history on file.  Family History   Problem Relation Age of Onset   Cervical cancer Mother    Diabetes Father    Hypertension Father    Hypertension Paternal Grandmother    Diabetes Paternal Grandmother    Hypertension Paternal Grandfather    Diabetes Paternal Grandfather    Colon cancer Neg Hx     Social History   Socioeconomic History   Marital status: Single    Spouse name: Not on file   Number of children: Not on file   Years of education: Not on file   Highest education level: Some college, no degree  Occupational History   Not on file  Tobacco Use   Smoking status: Never   Smokeless tobacco: Never  Vaping Use   Vaping status: Never Used  Substance and Sexual Activity   Alcohol use: Not Currently    Comment: occ   Drug use: Never   Sexual activity: Yes    Birth control/protection: None  Other Topics Concern   Not on file  Social History Narrative   Not on file   Social Drivers of Health   Financial Resource Strain: Medium Risk (03/10/2024)   Overall Financial Resource Strain (CARDIA)    Difficulty of Paying Living Expenses: Somewhat hard  Food Insecurity: Food Insecurity Present (03/10/2024)   Hunger Vital Sign    Worried About Running Out of Food in the Last Year: Sometimes true    Ran Out of Food in the Last Year: Sometimes true  Transportation Needs: No Transportation Needs (03/10/2024)   PRAPARE - Transportation  Lack of Transportation (Medical): No    Lack of Transportation (Non-Medical): No  Physical Activity: Sufficiently Active (03/10/2024)   Exercise Vital Sign    Days of Exercise per Week: 5 days    Minutes of Exercise per Session: 30 min  Stress: No Stress Concern Present (03/10/2024)   [employer] of Occupational Health - Occupational Stress Questionnaire    Feeling of Stress: Not at all  Social Connections: Moderately Isolated (03/10/2024)   Social Connection and Isolation Panel    Frequency of Communication with Friends and Family: More than three times a week     Frequency of Social Gatherings with Friends and Family: Twice a week    Attends Religious Services: 1 to 4 times per year    Active Member of [employer] or Organizations: No    Attends Engineer, Structural: Not on file    Marital Status: Never married  Intimate Partner Violence: Not At Risk (12/14/2022)   Humiliation, Afraid, Rape, and Kick questionnaire    Fear of Current or Ex-Partner: No    Emotionally Abused: No    Physically Abused: No    Sexually Abused: No    Review of Systems  Constitutional:  Positive for malaise/fatigue.        Objective    BP 120/70 (Cuff Size: Large)   Pulse 98   Temp 98 F (36.7 C) (Oral)   Resp 16   Ht 5' 10.5 (1.791 m)   Wt 283 lb 4.8 oz (128.5 kg)   LMP 10/29/2023 (Approximate)   SpO2 98%   BMI 40.07 kg/m   Physical Exam Constitutional:      Appearance: Normal appearance.  HENT:     Head: Normocephalic and atraumatic.     Mouth/Throat:     Mouth: Mucous membranes are moist.     Pharynx: Oropharynx is clear.  Eyes:     Extraocular Movements: Extraocular movements intact.     Conjunctiva/sclera: Conjunctivae normal.     Pupils: Pupils are equal, round, and reactive to light.  Neck:     Comments: No thyromegaly Cardiovascular:     Rate and Rhythm: Normal rate and regular rhythm.  Pulmonary:     Effort: Pulmonary effort is normal.     Breath sounds: Normal breath sounds.  Musculoskeletal:     Cervical back: No tenderness.     Right lower leg: No edema.     Left lower leg: No edema.  Lymphadenopathy:     Cervical: No cervical adenopathy.  Skin:    General: Skin is warm and dry.  Neurological:     General: No focal deficit present.     Mental Status: She is alert. Mental status is at baseline.  Psychiatric:        Mood and Affect: Mood normal.        Behavior: Behavior normal.         Assessment & Plan:   Assessment & Plan Hypothyroidism Mildly elevated TSH at 5.2 with normal free T4 indicates borderline  hypothyroidism. Discussed potential Hashimoto's disease due to family history and symptoms. Explained risks of untreated hypothyroidism and benefits of treatment. - Started levothyroxine 25 mcg daily on an empty stomach in the morning. - Scheduled follow-up in 6-8 weeks for repeat TSH and autoimmune marker testing. - Will perform physical exam of thyroid  gland at each visit.  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 Discussed potential for future outbreaks and importance of early treatment. - Prescribed Valtrex  for  use at the onset of symptoms.  General Health Maintenance Discussed importance of regular screenings and vaccinations. Reviewed family history of cancers and diabetes. Explained HPV vaccination benefits and schedule. - Administered first dose of HPV vaccine today. - Scheduled follow-up for second dose of HPV vaccine in 30 days. - Plan for third dose of HPV vaccine six months after the first dose. - Will schedule fasting labs in 6-8 weeks, including thyroid  function, autoimmune marker, kidney and liver function, electrolytes, blood sugar, and cholesterol screening.  - levothyroxine (SYNTHROID) 25 MCG tablet; Take 1 tablet (25 mcg total) by mouth daily.  Dispense: 30 tablet; Refill: 1 - valACYclovir  (VALTREX ) 1000 MG tablet; Take 1 tablet (1,000 mg total) by mouth 2 (two) times daily for 7 days.  Dispense: 14 tablet; Refill: 0 - HPV 9-valent vaccine,Recombinat  Return in about 6 weeks (around 04/21/2024).   Sharyle Fischer, DO

## 2024-03-23 ENCOUNTER — Ambulatory Visit: Admitting: Obstetrics and Gynecology

## 2024-03-23 VITALS — BP 123/81 | HR 75 | Ht 71.0 in | Wt 283.0 lb

## 2024-03-23 DIAGNOSIS — N911 Secondary amenorrhea: Secondary | ICD-10-CM | POA: Diagnosis not present

## 2024-03-23 NOTE — Progress Notes (Unsigned)
° °  RETURN GYNECOLOGY VISIT  Subjective:  Sarah Villanueva is a 33 y.o. [id number] presenting for follow up of secondary amenorrhea.   Last normal cycle in July 2025 (~5 months ago). Had 2 days of spotting in Sept. Besides issues with weight loss, no other localizing signs/symptoms. She confirms no acne/hair growth. We completed the following work up on 02/10/24 - UPT neg - PRL 11.1 (normal) - FSH 3.3 -- normal range for luteal phase - E2 174 -- normal range for luteal phase - TSH 5.22 (mildly elevated) w/ fT4 0.9 (normal)  She had a cramping and spotting on provera  then had some bleeding not typical for her period for a couple of days after stopping.   She has since seen her PCP who has started synthroid  25mcg and she has follow up for repeat labs on 1/16  I personally reviewed the above results and PCP note from Dr. Bernardo on 12/4  Objective:   Vitals:   03/23/24 1431  BP: 123/81  Pulse: 75  Weight: 283 lb (128.4 kg)  Height: 5' 11 (1.803 m)   General:  Alert, oriented and cooperative. Patient is in no acute distress.  Skin: Skin is warm and dry. No rash noted.   Cardiovascular: Normal heart rate noted  Respiratory: Normal respiratory effort, no problems with respiration noted   Assessment and Plan:  Sarah Villanueva is a 33 y.o. with  1. Secondary amenorrhea (Primary) Discussed  - US  PELVIC COMPLETE WITH TRANSVAGINAL; Future   Return in about 5 weeks (around 04/27/2024) for follow up missed periods.  Future Appointments  Date Time Provider Department Center  04/22/2024  2:40 PM Bernardo Fend, DO [facility] Michaela Kieth JAYSON Erik, MD

## 2024-03-25 ENCOUNTER — Ambulatory Visit (INDEPENDENT_AMBULATORY_CARE_PROVIDER_SITE_OTHER)

## 2024-03-25 DIAGNOSIS — N911 Secondary amenorrhea: Secondary | ICD-10-CM

## 2024-04-05 ENCOUNTER — Ambulatory Visit: Payer: Self-pay | Admitting: Obstetrics and Gynecology

## 2024-04-22 ENCOUNTER — Encounter: Payer: Self-pay | Admitting: Internal Medicine

## 2024-04-22 ENCOUNTER — Ambulatory Visit: Admitting: Internal Medicine

## 2024-04-22 VITALS — BP 120/82 | HR 95 | Temp 97.7°F | Resp 16 | Ht 71.0 in | Wt 281.5 lb

## 2024-04-22 DIAGNOSIS — Z862 Personal history of diseases of the blood and blood-forming organs and certain disorders involving the immune mechanism: Secondary | ICD-10-CM | POA: Diagnosis not present

## 2024-04-22 DIAGNOSIS — R5383 Other fatigue: Secondary | ICD-10-CM

## 2024-04-22 DIAGNOSIS — E039 Hypothyroidism, unspecified: Secondary | ICD-10-CM | POA: Diagnosis not present

## 2024-04-22 DIAGNOSIS — Z13220 Encounter for screening for lipoid disorders: Secondary | ICD-10-CM | POA: Diagnosis not present

## 2024-04-22 DIAGNOSIS — E559 Vitamin D deficiency, unspecified: Secondary | ICD-10-CM

## 2024-04-22 DIAGNOSIS — Z23 Encounter for immunization: Secondary | ICD-10-CM | POA: Diagnosis not present

## 2024-04-22 DIAGNOSIS — Z8632 Personal history of gestational diabetes: Secondary | ICD-10-CM | POA: Diagnosis not present

## 2024-04-22 DIAGNOSIS — N912 Amenorrhea, unspecified: Secondary | ICD-10-CM

## 2024-04-22 NOTE — Progress Notes (Signed)
 "  Established Patient Office Visit  Subjective    Patient ID: Sarah Villanueva, female    DOB: 1990-08-11  Age: 34 y.o. MRN: [id number]  CC:  Chief Complaint  Patient presents with   Medical Management of Chronic Issues    HPI Sarah Villanueva presents to follow up on thyroid  medication.   Discussed the use of AI scribe software for clinical note transcription with the patient, who gave verbal consent to proceed.  History of Present Illness  Sarah Villanueva is a 34 year old female with hypothyroidism who presents with persistent fatigue and amenorrhea.  She has persistent fatigue without improvement since starting levothyroxine  and new headaches that began after starting the medication. Her TSH was mildly elevated at 5.2 with normal thyroid  hormone levels, similar to a TSH of 5.7 four years ago. She was started on levothyroxine  for hypothyroid symptoms.  She has had amenorrhea for about six months, starting last July, with previously regular monthly periods. Thyroid  treatment has not led to resumption of menses. Gynecology evaluation, including ultrasound and hormone testing, did not show a reproductive cause.  Her past medical history is notable for gestational diabetes in her most recent pregnancy and anemia.  She is fasting today in anticipation of blood work, although the appointment is in the afternoon rather than early morning as she expected.  Health Maintenance: -Blood work due -Pap 2/24 negative  -Second HPV vaccine today  Outpatient Encounter Medications as of 04/22/2024  Medication Sig   levothyroxine  (SYNTHROID ) 25 MCG tablet Take 1 tablet (25 mcg total) by mouth daily.   No facility-administered encounter medications on file as of 04/22/2024.    Past Medical History:  Diagnosis Date   Anemia    Cystic fibrosis carrier    Gestational diabetes    HSV infection    on Valtrex    Obesity    Trichomonal vaginitis during pregnancy     History reviewed. No  pertinent surgical history.  Family History  Problem Relation Age of Onset   Cervical cancer Mother    Diabetes Father    Hypertension Father    Hypertension Paternal Grandmother    Diabetes Paternal Grandmother    Hypertension Paternal Grandfather    Diabetes Paternal Grandfather    Cancer Paternal Grandfather    Colon cancer Neg Hx     Social History   Socioeconomic History   Marital status: Single    Spouse name: Not on file   Number of children: Not on file   Years of education: Not on file   Highest education level: Some college, no degree  Occupational History   Not on file  Tobacco Use   Smoking status: Never   Smokeless tobacco: Never  Vaping Use   Vaping status: Never Used  Substance and Sexual Activity   Alcohol use: Not Currently    Comment: occ   Drug use: Never   Sexual activity: Yes    Birth control/protection: None  Other Topics Concern   Not on file  Social History Narrative   Not on file   Social Drivers of Health   Tobacco Use: Low Risk (04/22/2024)   Patient History    Smoking Tobacco Use: Never    Smokeless Tobacco Use: Never    Passive Exposure: Not on file  Financial Resource Strain: Medium Risk (03/10/2024)   Overall Financial Resource Strain (CARDIA)    Difficulty of Paying Living Expenses: Somewhat hard  Food Insecurity: Food Insecurity Present (03/10/2024)   Epic    Worried  About Running Out of Food in the Last Year: Sometimes true    Ran Out of Food in the Last Year: Sometimes true  Transportation Needs: No Transportation Needs (03/10/2024)   Epic    Lack of Transportation (Medical): No    Lack of Transportation (Non-Medical): No  Physical Activity: Sufficiently Active (03/10/2024)   Exercise Vital Sign    Days of Exercise per Week: 5 days    Minutes of Exercise per Session: 30 min  Stress: No Stress Concern Present (03/10/2024)   [employer] of Occupational Health - Occupational Stress Questionnaire    Feeling of Stress: Not  at all  Social Connections: Moderately Isolated (03/10/2024)   Social Connection and Isolation Panel    Frequency of Communication with Friends and Family: More than three times a week    Frequency of Social Gatherings with Friends and Family: Twice a week    Attends Religious Services: 1 to 4 times per year    Active Member of [employer] or Organizations: No    Attends Engineer, Structural: Not on file    Marital Status: Never married  Intimate Partner Violence: Not At Risk (12/14/2022)   Humiliation, Afraid, Rape, and Kick questionnaire    Fear of Current or Ex-Partner: No    Emotionally Abused: No    Physically Abused: No    Sexually Abused: No  Depression (PHQ2-9): Low Risk (04/22/2024)   Depression (PHQ2-9)    PHQ-2 Score: 0  Alcohol Screen: Low Risk (03/10/2024)   Alcohol Screen    Last Alcohol Screening Score (AUDIT): 1  Housing: High Risk (03/10/2024)   Epic    Unable to Pay for Housing in the Last Year: Yes    Number of Times Moved in the Last Year: 0    Homeless in the Last Year: No  Utilities: Not At Risk (12/14/2022)   [employer] Utilities    Threatened with loss of utilities: No  Health Literacy: Not on file    Review of Systems  Constitutional:  Positive for malaise/fatigue.        Objective    BP 120/82   Pulse 95   Temp 97.7 F (36.5 C) (Oral)   Resp 16   Ht 5' 11 (1.803 m)   Wt 281 lb 8 oz (127.7 kg)   LMP 11/05/2023 (Approximate)   SpO2 97%   Breastfeeding No   BMI 39.26 kg/m   Physical Exam Constitutional:      Appearance: Normal appearance.  HENT:     Head: Normocephalic and atraumatic.  Eyes:     Conjunctiva/sclera: Conjunctivae normal.  Cardiovascular:     Rate and Rhythm: Normal rate and regular rhythm.  Pulmonary:     Effort: Pulmonary effort is normal.     Breath sounds: Normal breath sounds.  Skin:    General: Skin is warm and dry.  Neurological:     General: No focal deficit present.     Mental Status: She is alert. Mental status  is at baseline.  Psychiatric:        Mood and Affect: Mood normal.        Behavior: Behavior normal.         Assessment & Plan:   Assessment & Plan  Secondary amenorrhea Extensive gynecological evaluation showed no reproductive issues. Possible factors include stress, weight changes, and subclinical hypothyroidism. - Checked thyroid  panel. - Evaluated stress and weight changes.  Hypothyroidism/Fatigue  TSH slightly elevated at 5.2. Symptoms persist despite levothyroxine . Normal thyroid  hormone levels.  Risk of hyperthyroidism if TSH not monitored. - Rechecked TSH and thyroid  hormone levels. - Continue levothyroxine  if TSH normal. - Consider increasing levothyroxine  if TSH remains high. - Discontinue levothyroxine  if TSH drops too low.  History of anemia No recent evaluation since last year. - Recheck CBC.   Hx of Gestation Diabetes - Recheck A1c.   - Comprehensive Metabolic Panel (CMET) - CBC w/Diff/Platelet - Thyroid  Panel With TSH - Vitamin D  (25 hydroxy) - HgB A1c - HPV 9-valent vaccine,Recombinat - Lipid Profile  Return for will call to schedule.   Sharyle Fischer, DO  "

## 2024-04-23 LAB — COMPREHENSIVE METABOLIC PANEL WITH GFR
AG Ratio: 1.3 (calc) (ref 1.0–2.5)
ALT: 20 U/L (ref 6–29)
AST: 19 U/L (ref 10–30)
Albumin: 4.3 g/dL (ref 3.6–5.1)
Alkaline phosphatase (APISO): 68 U/L (ref 31–125)
BUN: 12 mg/dL (ref 7–25)
CO2: 28 mmol/L (ref 20–32)
Calcium: 9.2 mg/dL (ref 8.6–10.2)
Chloride: 101 mmol/L (ref 98–110)
Creat: 0.55 mg/dL (ref 0.50–0.97)
Globulin: 3.4 g/dL (ref 1.9–3.7)
Glucose, Bld: 77 mg/dL (ref 65–99)
Potassium: 4.3 mmol/L (ref 3.5–5.3)
Sodium: 138 mmol/L (ref 135–146)
Total Bilirubin: 0.5 mg/dL (ref 0.2–1.2)
Total Protein: 7.7 g/dL (ref 6.1–8.1)
eGFR: 126 mL/min/1.73m2

## 2024-04-23 LAB — LIPID PANEL
Cholesterol: 194 mg/dL
HDL: 55 mg/dL
LDL Cholesterol (Calc): 112 mg/dL — ABNORMAL HIGH
Non-HDL Cholesterol (Calc): 139 mg/dL — ABNORMAL HIGH
Total CHOL/HDL Ratio: 3.5 (calc)
Triglycerides: 155 mg/dL — ABNORMAL HIGH

## 2024-04-23 LAB — THYROID PANEL WITH TSH
Free Thyroxine Index: 2.1 (ref 1.4–3.8)
T3 Uptake: 23 % (ref 22–35)
T4, Total: 9.1 ug/dL (ref 5.1–11.9)
TSH: 4.12 m[IU]/L

## 2024-04-23 LAB — CBC WITH DIFFERENTIAL/PLATELET
Absolute Lymphocytes: 3011 {cells}/uL (ref 850–3900)
Absolute Monocytes: 668 {cells}/uL (ref 200–950)
Basophils Absolute: 25 {cells}/uL (ref 0–200)
Basophils Relative: 0.2 %
Eosinophils Absolute: 113 {cells}/uL (ref 15–500)
Eosinophils Relative: 0.9 %
HCT: 37.7 % (ref 35.9–46.0)
Hemoglobin: 11.9 g/dL (ref 11.7–15.5)
MCH: 25.4 pg — ABNORMAL LOW (ref 27.0–33.0)
MCHC: 31.6 g/dL (ref 31.6–35.4)
MCV: 80.4 fL — ABNORMAL LOW (ref 81.4–101.7)
MPV: 9.3 fL (ref 7.5–12.5)
Monocytes Relative: 5.3 %
Neutro Abs: 8782 {cells}/uL — ABNORMAL HIGH (ref 1500–7800)
Neutrophils Relative %: 69.7 %
Platelets: 380 Thousand/uL (ref 140–400)
RBC: 4.69 Million/uL (ref 3.80–5.10)
RDW: 14.2 % (ref 11.0–15.0)
Total Lymphocyte: 23.9 %
WBC: 12.6 Thousand/uL — ABNORMAL HIGH (ref 3.8–10.8)

## 2024-04-23 LAB — HEMOGLOBIN A1C
Hgb A1c MFr Bld: 5.8 % — ABNORMAL HIGH
Mean Plasma Glucose: 120 mg/dL
eAG (mmol/L): 6.6 mmol/L

## 2024-04-23 LAB — VITAMIN D 25 HYDROXY (VIT D DEFICIENCY, FRACTURES): Vit D, 25-Hydroxy: 24 ng/mL — ABNORMAL LOW (ref 30–100)

## 2024-04-25 ENCOUNTER — Ambulatory Visit: Payer: Self-pay | Admitting: Internal Medicine

## 2024-04-25 DIAGNOSIS — E039 Hypothyroidism, unspecified: Secondary | ICD-10-CM

## 2024-04-25 DIAGNOSIS — E559 Vitamin D deficiency, unspecified: Secondary | ICD-10-CM

## 2024-04-25 MED ORDER — VITAMIN D (ERGOCALCIFEROL) 1.25 MG (50000 UNIT) PO CAPS: 50000.0000 [IU] | ORAL_CAPSULE | ORAL | 0 refills | Status: AC

## 2024-04-25 MED ORDER — LEVOTHYROXINE SODIUM 25 MCG PO TABS: 25.0000 ug | ORAL_TABLET | Freq: Every day | ORAL | 1 refills | Status: AC
